# Patient Record
Sex: Male | Born: 1980 | Race: White | Hispanic: No | Marital: Single | Smoking: Current every day smoker
Health system: Southern US, Community
[De-identification: ages and names within clinical notes are randomized; demographics above are authoritative.]

## PROBLEM LIST (undated history)

## (undated) VITALS — BP 107/74 | HR 106 | Temp 97.2°F | Resp 24 | Ht 76.0 in | Wt 150.0 lb

## (undated) DIAGNOSIS — F1110 Opioid abuse, uncomplicated: Secondary | ICD-10-CM

## (undated) DIAGNOSIS — B029 Zoster without complications: Secondary | ICD-10-CM

## (undated) HISTORY — PX: HERNIA REPAIR: SHX51

## (undated) HISTORY — PX: FRACTURE SURGERY: SHX138

---

## 2007-09-15 ENCOUNTER — Emergency Department (HOSPITAL_COMMUNITY): Admission: EM | Admit: 2007-09-15 | Discharge: 2007-09-15 | Payer: Self-pay | Admitting: Emergency Medicine

## 2008-10-02 ENCOUNTER — Emergency Department (HOSPITAL_COMMUNITY): Admission: EM | Admit: 2008-10-02 | Discharge: 2008-10-02 | Payer: Self-pay | Admitting: Emergency Medicine

## 2008-11-19 ENCOUNTER — Emergency Department (HOSPITAL_COMMUNITY): Admission: EM | Admit: 2008-11-19 | Discharge: 2008-11-19 | Payer: Self-pay | Admitting: Emergency Medicine

## 2009-01-25 ENCOUNTER — Emergency Department (HOSPITAL_COMMUNITY): Admission: EM | Admit: 2009-01-25 | Discharge: 2009-01-25 | Payer: Self-pay | Admitting: Emergency Medicine

## 2009-07-11 ENCOUNTER — Emergency Department (HOSPITAL_COMMUNITY): Admission: EM | Admit: 2009-07-11 | Discharge: 2009-07-11 | Payer: Self-pay | Admitting: Emergency Medicine

## 2009-07-24 ENCOUNTER — Encounter: Admission: RE | Admit: 2009-07-24 | Discharge: 2009-08-15 | Payer: Self-pay | Admitting: Orthopedic Surgery

## 2009-11-26 ENCOUNTER — Emergency Department (HOSPITAL_COMMUNITY): Admission: EM | Admit: 2009-11-26 | Discharge: 2009-11-27 | Payer: Self-pay | Admitting: Emergency Medicine

## 2010-01-23 ENCOUNTER — Emergency Department (HOSPITAL_COMMUNITY): Admission: EM | Admit: 2010-01-23 | Discharge: 2010-01-24 | Payer: Self-pay | Admitting: Emergency Medicine

## 2010-06-20 ENCOUNTER — Emergency Department (HOSPITAL_COMMUNITY)
Admission: EM | Admit: 2010-06-20 | Discharge: 2010-06-20 | Payer: Self-pay | Source: Home / Self Care | Admitting: Family Medicine

## 2010-09-06 LAB — RAPID URINE DRUG SCREEN, HOSP PERFORMED
Amphetamines: NOT DETECTED
Cocaine: NOT DETECTED
Opiates: NOT DETECTED

## 2010-09-06 LAB — ETHANOL: Alcohol, Ethyl (B): 136 mg/dL — ABNORMAL HIGH (ref 0–10)

## 2010-09-06 LAB — COMPREHENSIVE METABOLIC PANEL
ALT: 25 U/L (ref 0–53)
Alkaline Phosphatase: 79 U/L (ref 39–117)
GFR calc Af Amer: >60 mL/min (ref >60)
GFR calc non Af Amer: >60 mL/min (ref >60)
Potassium: 4 mEq/L (ref 3.5–5.1)
Sodium: 140 mEq/L (ref 135–145)
Total Bilirubin: 1 mg/dL (ref 0.3–1.2)

## 2010-09-06 LAB — DIFFERENTIAL
Basophils Relative: 0 % (ref 0–1)
Eosinophils Absolute: 0.1 10*3/uL (ref 0.0–0.7)
Eosinophils Relative: 1 % (ref 0–5)

## 2010-09-06 LAB — CBC
HCT: 43 % (ref 39.0–52.0)
Hemoglobin: 15.1 g/dL (ref 13.0–17.0)
MCV: 91.4 fL (ref 78.0–100.0)
WBC: 5.4 10*3/uL (ref 4.0–10.5)

## 2010-09-08 ENCOUNTER — Emergency Department (HOSPITAL_COMMUNITY): Payer: Self-pay

## 2010-09-08 ENCOUNTER — Emergency Department (HOSPITAL_COMMUNITY)
Admission: EM | Admit: 2010-09-08 | Discharge: 2010-09-08 | Disposition: A | Discharge status: Home / Self Care | Payer: Self-pay | Attending: Emergency Medicine | Admitting: Emergency Medicine

## 2010-09-08 DIAGNOSIS — X500XXA Overexertion from strenuous movement or load, initial encounter: Secondary | ICD-10-CM | POA: Insufficient documentation

## 2010-09-08 DIAGNOSIS — M25569 Pain in unspecified knee: Secondary | ICD-10-CM | POA: Insufficient documentation

## 2010-09-08 DIAGNOSIS — Y9366 Activity, soccer: Secondary | ICD-10-CM | POA: Insufficient documentation

## 2010-09-09 LAB — RAPID URINE DRUG SCREEN, HOSP PERFORMED
Amphetamines: NOT DETECTED
Barbiturates: NOT DETECTED
Barbiturates: NOT DETECTED
Benzodiazepines: NOT DETECTED
Benzodiazepines: NOT DETECTED
Cocaine: NOT DETECTED
Cocaine: NOT DETECTED
Tetrahydrocannabinol: POSITIVE — AB

## 2010-09-09 LAB — BASIC METABOLIC PANEL
Chloride: 109 mEq/L (ref 96–112)
GFR calc Af Amer: >60 mL/min (ref >60)
Potassium: 3.8 mEq/L (ref 3.5–5.1)

## 2010-09-09 LAB — URINALYSIS, ROUTINE W REFLEX MICROSCOPIC
Bilirubin Urine: NEGATIVE
Glucose, UA: NEGATIVE mg/dL
Hgb urine dipstick: NEGATIVE
Specific Gravity, Urine: 1.011 (ref 1.005–1.030)
pH: 5.5 (ref 5.0–8.0)

## 2010-09-09 LAB — HEPATIC FUNCTION PANEL
ALT: 23 U/L (ref 0–53)
AST: 26 U/L (ref 0–37)
Albumin: 4.2 g/dL (ref 3.5–5.2)
Alkaline Phosphatase: 85 U/L (ref 39–117)
Total Protein: 7 g/dL (ref 6.0–8.3)

## 2010-09-09 LAB — DIFFERENTIAL
Eosinophils Absolute: 0 10*3/uL (ref 0.0–0.7)
Eosinophils Relative: 1 % (ref 0–5)
Lymphocytes Relative: 30 % (ref 12–46)
Lymphs Abs: 1.8 10*3/uL (ref 0.7–4.0)
Monocytes Relative: 5 % (ref 3–12)
Neutrophils Relative %: 64 % (ref 43–77)

## 2010-09-09 LAB — ETHANOL: Alcohol, Ethyl (B): 167 mg/dL — ABNORMAL HIGH (ref 0–10)

## 2010-09-09 LAB — TRICYCLICS SCREEN, URINE: TCA Scrn: NOT DETECTED

## 2010-09-09 LAB — CBC
HCT: 44.9 % (ref 39.0–52.0)
RDW: 13 % (ref 11.5–15.5)

## 2010-09-16 ENCOUNTER — Other Ambulatory Visit (HOSPITAL_COMMUNITY): Payer: Self-pay | Admitting: Orthopedic Surgery

## 2010-09-16 DIAGNOSIS — M25561 Pain in right knee: Secondary | ICD-10-CM

## 2010-09-16 DIAGNOSIS — S83519A Sprain of anterior cruciate ligament of unspecified knee, initial encounter: Secondary | ICD-10-CM

## 2010-09-20 ENCOUNTER — Ambulatory Visit (HOSPITAL_COMMUNITY)
Admission: RE | Admit: 2010-09-20 | Discharge: 2010-09-20 | Disposition: A | Discharge status: Home / Self Care | Payer: Self-pay | Source: Ambulatory Visit | Attending: Orthopedic Surgery | Admitting: Orthopedic Surgery

## 2010-09-20 DIAGNOSIS — M948X9 Other specified disorders of cartilage, unspecified sites: Secondary | ICD-10-CM | POA: Insufficient documentation

## 2010-09-20 DIAGNOSIS — M25569 Pain in unspecified knee: Secondary | ICD-10-CM | POA: Insufficient documentation

## 2010-09-20 DIAGNOSIS — S83519A Sprain of anterior cruciate ligament of unspecified knee, initial encounter: Secondary | ICD-10-CM

## 2010-09-20 DIAGNOSIS — M25561 Pain in right knee: Secondary | ICD-10-CM

## 2010-12-21 ENCOUNTER — Emergency Department (HOSPITAL_COMMUNITY): Payer: Self-pay

## 2010-12-21 ENCOUNTER — Emergency Department (HOSPITAL_COMMUNITY)
Admission: EM | Admit: 2010-12-21 | Discharge: 2010-12-21 | Disposition: A | Discharge status: Home / Self Care | Payer: Self-pay | Attending: Emergency Medicine | Admitting: Emergency Medicine

## 2010-12-21 DIAGNOSIS — Z87898 Personal history of other specified conditions: Secondary | ICD-10-CM | POA: Insufficient documentation

## 2010-12-21 DIAGNOSIS — R071 Chest pain on breathing: Secondary | ICD-10-CM | POA: Insufficient documentation

## 2010-12-21 LAB — POCT I-STAT, CHEM 8
Chloride: 105 mEq/L (ref 96–112)
HCT: 41 % (ref 39.0–52.0)
Potassium: 3.6 mEq/L (ref 3.5–5.1)

## 2010-12-21 LAB — CK TOTAL AND CKMB (NOT AT ARMC)
CK, MB: 2.9 ng/mL (ref 0.3–4.0)
Relative Index: 1.8 (ref 0.0–2.5)
Total CK: 162 U/L (ref 7–232)

## 2010-12-21 LAB — TROPONIN I: Troponin I: <0.3 ng/mL (ref <0.30)

## 2011-01-21 ENCOUNTER — Emergency Department (HOSPITAL_COMMUNITY)
Admission: EM | Admit: 2011-01-21 | Discharge: 2011-01-21 | Disposition: A | Discharge status: Home / Self Care | Payer: Self-pay | Attending: Emergency Medicine | Admitting: Emergency Medicine

## 2011-01-21 DIAGNOSIS — R112 Nausea with vomiting, unspecified: Secondary | ICD-10-CM | POA: Insufficient documentation

## 2011-01-21 DIAGNOSIS — E860 Dehydration: Secondary | ICD-10-CM | POA: Insufficient documentation

## 2011-01-21 DIAGNOSIS — F411 Generalized anxiety disorder: Secondary | ICD-10-CM | POA: Insufficient documentation

## 2011-01-21 DIAGNOSIS — K219 Gastro-esophageal reflux disease without esophagitis: Secondary | ICD-10-CM | POA: Insufficient documentation

## 2011-01-21 DIAGNOSIS — E876 Hypokalemia: Secondary | ICD-10-CM | POA: Insufficient documentation

## 2011-01-21 DIAGNOSIS — R109 Unspecified abdominal pain: Secondary | ICD-10-CM | POA: Insufficient documentation

## 2011-01-21 LAB — URINE MICROSCOPIC-ADD ON

## 2011-01-21 LAB — URINALYSIS, ROUTINE W REFLEX MICROSCOPIC
Glucose, UA: NEGATIVE mg/dL
Ketones, ur: 40 mg/dL — AB
Leukocytes, UA: NEGATIVE
pH: 7 (ref 5.0–8.0)

## 2011-01-21 LAB — CBC
MCV: 88.1 fL (ref 78.0–100.0)
Platelets: 188 10*3/uL (ref 150–400)
RDW: 11.8 % (ref 11.5–15.5)
WBC: 5.4 10*3/uL (ref 4.0–10.5)

## 2011-01-21 LAB — DIFFERENTIAL
Basophils Absolute: 0 10*3/uL (ref 0.0–0.1)
Eosinophils Absolute: 0 10*3/uL (ref 0.0–0.7)
Eosinophils Relative: 0 % (ref 0–5)

## 2011-01-21 LAB — COMPREHENSIVE METABOLIC PANEL
AST: 21 U/L (ref 0–37)
Albumin: 4.3 g/dL (ref 3.5–5.2)
Chloride: 96 mEq/L (ref 96–112)
Creatinine, Ser: 0.79 mg/dL (ref 0.50–1.35)
Total Bilirubin: 1.2 mg/dL (ref 0.3–1.2)

## 2011-01-29 ENCOUNTER — Emergency Department (HOSPITAL_COMMUNITY): Payer: Self-pay

## 2011-01-29 ENCOUNTER — Emergency Department (HOSPITAL_COMMUNITY)
Admission: EM | Admit: 2011-01-29 | Discharge: 2011-01-30 | Disposition: A | Discharge status: Home / Self Care | Payer: Self-pay | Attending: Emergency Medicine | Admitting: Emergency Medicine

## 2011-01-29 DIAGNOSIS — R112 Nausea with vomiting, unspecified: Secondary | ICD-10-CM | POA: Insufficient documentation

## 2011-01-29 DIAGNOSIS — R079 Chest pain, unspecified: Secondary | ICD-10-CM | POA: Insufficient documentation

## 2011-01-29 DIAGNOSIS — R0602 Shortness of breath: Secondary | ICD-10-CM | POA: Insufficient documentation

## 2011-01-29 DIAGNOSIS — F1110 Opioid abuse, uncomplicated: Secondary | ICD-10-CM | POA: Insufficient documentation

## 2011-01-29 LAB — COMPREHENSIVE METABOLIC PANEL
AST: 27 U/L (ref 0–37)
Albumin: 4.5 g/dL (ref 3.5–5.2)
Calcium: 10 mg/dL (ref 8.4–10.5)
Creatinine, Ser: 0.89 mg/dL (ref 0.50–1.35)
Total Protein: 8.4 g/dL — ABNORMAL HIGH (ref 6.0–8.3)

## 2011-01-29 LAB — RAPID URINE DRUG SCREEN, HOSP PERFORMED
Amphetamines: NOT DETECTED
Cocaine: NOT DETECTED
Opiates: POSITIVE — AB
Tetrahydrocannabinol: NOT DETECTED

## 2011-01-29 LAB — DIFFERENTIAL
Basophils Absolute: 0 10*3/uL (ref 0.0–0.1)
Basophils Relative: 0 % (ref 0–1)
Eosinophils Relative: 1 % (ref 0–5)
Monocytes Absolute: 0.6 10*3/uL (ref 0.1–1.0)
Monocytes Relative: 9 % (ref 3–12)

## 2011-01-29 LAB — CBC
HCT: 40.1 % (ref 39.0–52.0)
MCH: 32.3 pg (ref 26.0–34.0)
MCHC: 37.7 g/dL — ABNORMAL HIGH (ref 30.0–36.0)
RDW: 11.5 % (ref 11.5–15.5)

## 2011-01-29 LAB — LIPASE, BLOOD: Lipase: 23 U/L (ref 11–59)

## 2011-04-08 ENCOUNTER — Emergency Department (HOSPITAL_COMMUNITY)
Admission: EM | Admit: 2011-04-08 | Discharge: 2011-04-08 | Disposition: A | Discharge status: Home / Self Care | Payer: Self-pay | Attending: Emergency Medicine | Admitting: Emergency Medicine

## 2011-04-08 DIAGNOSIS — J029 Acute pharyngitis, unspecified: Secondary | ICD-10-CM | POA: Insufficient documentation

## 2011-04-08 DIAGNOSIS — H9209 Otalgia, unspecified ear: Secondary | ICD-10-CM | POA: Insufficient documentation

## 2011-04-08 DIAGNOSIS — R599 Enlarged lymph nodes, unspecified: Secondary | ICD-10-CM | POA: Insufficient documentation

## 2011-04-08 LAB — RAPID STREP SCREEN (MED CTR MEBANE ONLY): Streptococcus, Group A Screen (Direct): NEGATIVE

## 2011-05-07 ENCOUNTER — Encounter: Payer: Self-pay | Admitting: *Deleted

## 2011-05-07 ENCOUNTER — Emergency Department (HOSPITAL_COMMUNITY)
Admission: EM | Admit: 2011-05-07 | Discharge: 2011-05-07 | Disposition: A | Discharge status: Home / Self Care | Payer: Self-pay | Attending: Emergency Medicine | Admitting: Emergency Medicine

## 2011-05-07 DIAGNOSIS — B029 Zoster without complications: Secondary | ICD-10-CM | POA: Insufficient documentation

## 2011-05-07 DIAGNOSIS — F17200 Nicotine dependence, unspecified, uncomplicated: Secondary | ICD-10-CM | POA: Insufficient documentation

## 2011-05-07 MED ORDER — ACYCLOVIR 400 MG PO TABS: 800.0000 mg | ORAL_TABLET | Freq: Every day | ORAL | Status: AC

## 2011-05-07 MED ORDER — OXYCODONE-ACETAMINOPHEN 5-325 MG PO TABS: 2.0000 | ORAL_TABLET | ORAL | Status: AC | PRN

## 2011-05-07 NOTE — ED Notes (Signed)
Pt c/o rash and severe pain to mid R back. No drainage. C/o shob as well, he believes is r/t the pain.

## 2011-05-07 NOTE — ED Provider Notes (Signed)
History     CSN: [id number] Arrival date & time: 05/07/2011  9:59 AM   First MD Initiated Contact with Patient 05/07/11 1015      Chief Complaint  Patient presents with  . Rash    (Consider location/radiation/quality/duration/timing/severity/associated sxs/prior treatment) HPI Comments: No known sick contacts.  Patient reports that he is otherwise healthy.  Patient is a 30 y.o. male presenting with rash. The history is provided by the patient.  Rash  This is a new problem. The current episode started 2 days ago. The problem has been gradually worsening. The problem is associated with nothing. There has been no fever. The rash is present on the back. The pain is severe. Associated symptoms include pain. Pertinent negatives include no itching and no weeping. He has tried nothing for the symptoms.    Past Medical History  Diagnosis Date  . Pneumothorax     Past Surgical History  Procedure Date  . Hernia repair     No family history on file.  History  Substance Use Topics  . Smoking status: Current Everyday Smoker  . Smokeless tobacco: Not on file  . Alcohol Use: No     occasionally      Review of Systems  Constitutional: Negative for fever, chills, diaphoresis, activity change, appetite change, fatigue and unexpected weight change.  HENT: Negative for sore throat, neck pain and neck stiffness.   Gastrointestinal: Negative for nausea and vomiting.  Skin: Positive for rash. Negative for itching, pallor and wound.  Neurological: Negative for dizziness and light-headedness.    Allergies  Review of patient's allergies indicates no known allergies.  Home Medications   Current Outpatient Rx  Name Route Sig Dispense Refill  . IBUPROFEN 200 MG PO TABS Oral Take 600 mg by mouth every 6 (six) hours as needed. Pain        BP 126/69  Pulse 69  Temp(Src) 97.6 F (36.4 C) (Oral)  Resp 18  SpO2 100%  Physical Exam  Constitutional: He is oriented to person, place,  and time. He appears well-developed and well-nourished.  HENT:  Head: Normocephalic and atraumatic.  Eyes: EOM are normal. Pupils are equal, round, and reactive to light.  Neck: Normal range of motion. Neck supple.  Cardiovascular: Normal rate, regular rhythm and normal heart sounds.   Pulmonary/Chest: Effort normal and breath sounds normal.  Musculoskeletal: Normal range of motion.  Neurological: He is alert and oriented to person, place, and time.  Skin: Skin is warm and dry.     Psychiatric: He has a normal mood and affect.    ED Course  Procedures (including critical care time)  Labs Reviewed - No data to display No results found.   1. Shingles       MDM  History and PE consistent with Shingles.  Patient given pain medications and prescription for Acyclovir.  Patient instructed to follow up with Primary Care Provider for follow up and further testing if the rash becomes recurrent.        Pascal Lux [facility] 05/07/11 1621

## 2011-05-07 NOTE — ED Provider Notes (Signed)
Medical screening examination/treatment/procedure(s) were performed by non-physician practitioner and as supervising physician I was immediately available for consultation/collaboration.   Glynn Octave, MD 05/07/11 Rickey Primus

## 2011-05-07 NOTE — ED Notes (Signed)
Pt denies fever and chills.  

## 2011-05-07 NOTE — ED Notes (Signed)
Pt with round rash patch on right back c/o of pain to the area. Pt reports he noticed it 2 days ago and the pain is severe to day.

## 2011-06-25 ENCOUNTER — Emergency Department (HOSPITAL_COMMUNITY): Payer: Self-pay

## 2011-06-25 ENCOUNTER — Encounter (HOSPITAL_COMMUNITY): Payer: Self-pay

## 2011-06-25 ENCOUNTER — Emergency Department (HOSPITAL_COMMUNITY)
Admission: EM | Admit: 2011-06-25 | Discharge: 2011-06-25 | Disposition: A | Discharge status: Home / Self Care | Payer: Self-pay | Attending: Emergency Medicine | Admitting: Emergency Medicine

## 2011-06-25 DIAGNOSIS — J3489 Other specified disorders of nose and nasal sinuses: Secondary | ICD-10-CM | POA: Insufficient documentation

## 2011-06-25 DIAGNOSIS — R5383 Other fatigue: Secondary | ICD-10-CM | POA: Insufficient documentation

## 2011-06-25 DIAGNOSIS — K529 Noninfective gastroenteritis and colitis, unspecified: Secondary | ICD-10-CM

## 2011-06-25 DIAGNOSIS — R61 Generalized hyperhidrosis: Secondary | ICD-10-CM | POA: Insufficient documentation

## 2011-06-25 DIAGNOSIS — R197 Diarrhea, unspecified: Secondary | ICD-10-CM | POA: Insufficient documentation

## 2011-06-25 DIAGNOSIS — J40 Bronchitis, not specified as acute or chronic: Secondary | ICD-10-CM | POA: Insufficient documentation

## 2011-06-25 DIAGNOSIS — R1110 Vomiting, unspecified: Secondary | ICD-10-CM | POA: Insufficient documentation

## 2011-06-25 DIAGNOSIS — J209 Acute bronchitis, unspecified: Secondary | ICD-10-CM

## 2011-06-25 DIAGNOSIS — R51 Headache: Secondary | ICD-10-CM | POA: Insufficient documentation

## 2011-06-25 DIAGNOSIS — H5789 Other specified disorders of eye and adnexa: Secondary | ICD-10-CM | POA: Insufficient documentation

## 2011-06-25 DIAGNOSIS — K5289 Other specified noninfective gastroenteritis and colitis: Secondary | ICD-10-CM | POA: Insufficient documentation

## 2011-06-25 DIAGNOSIS — R0989 Other specified symptoms and signs involving the circulatory and respiratory systems: Secondary | ICD-10-CM | POA: Insufficient documentation

## 2011-06-25 DIAGNOSIS — F17200 Nicotine dependence, unspecified, uncomplicated: Secondary | ICD-10-CM | POA: Insufficient documentation

## 2011-06-25 DIAGNOSIS — R05 Cough: Secondary | ICD-10-CM | POA: Insufficient documentation

## 2011-06-25 DIAGNOSIS — R5381 Other malaise: Secondary | ICD-10-CM | POA: Insufficient documentation

## 2011-06-25 DIAGNOSIS — R059 Cough, unspecified: Secondary | ICD-10-CM | POA: Insufficient documentation

## 2011-06-25 HISTORY — DX: Zoster without complications: B02.9

## 2011-06-25 LAB — COMPREHENSIVE METABOLIC PANEL
ALT: 20 U/L (ref 0–53)
Alkaline Phosphatase: 72 U/L (ref 39–117)
CO2: 26 mEq/L (ref 19–32)
GFR calc Af Amer: >90 mL/min (ref >90)
GFR calc non Af Amer: >90 mL/min (ref >90)
Glucose, Bld: 85 mg/dL (ref 70–99)
Potassium: 3.9 mEq/L (ref 3.5–5.1)
Sodium: 133 mEq/L — ABNORMAL LOW (ref 135–145)
Total Protein: 7.8 g/dL (ref 6.0–8.3)

## 2011-06-25 LAB — DIFFERENTIAL
Lymphocytes Relative: 34 % (ref 12–46)
Lymphs Abs: 1.6 10*3/uL (ref 0.7–4.0)
Neutrophils Relative %: 57 % (ref 43–77)

## 2011-06-25 LAB — CBC
Platelets: 129 10*3/uL — ABNORMAL LOW (ref 150–400)
RBC: 4.54 MIL/uL (ref 4.22–5.81)
WBC: 4.9 10*3/uL (ref 4.0–10.5)

## 2011-06-25 MED ORDER — ONDANSETRON HCL 4 MG/2ML IJ SOLN
4.0000 mg | Freq: Once | INTRAMUSCULAR | Status: AC
  Administered 2011-06-25: 4 mg via INTRAVENOUS
  Filled 2011-06-25: qty 2

## 2011-06-25 MED ORDER — IPRATROPIUM BROMIDE 0.02 % IN SOLN
0.5000 mg | Freq: Once | RESPIRATORY_TRACT | Status: AC
  Administered 2011-06-25: 0.5 mg via RESPIRATORY_TRACT
  Filled 2011-06-25: qty 2.5

## 2011-06-25 MED ORDER — PROMETHAZINE HCL 25 MG PO TABS: 25.0000 mg | ORAL_TABLET | Freq: Four times a day (QID) | ORAL | Status: DC | PRN

## 2011-06-25 MED ORDER — ALBUTEROL SULFATE HFA 108 (90 BASE) MCG/ACT IN AERS
1.0000 | INHALATION_SPRAY | RESPIRATORY_TRACT | Status: DC | PRN
  Administered 2011-06-25: 2 via RESPIRATORY_TRACT
  Filled 2011-06-25: qty 6.7

## 2011-06-25 MED ORDER — SODIUM CHLORIDE 0.9 % IV BOLUS (SEPSIS)
1000.0000 mL | Freq: Once | INTRAVENOUS | Status: AC
  Administered 2011-06-25: 1000 mL via INTRAVENOUS

## 2011-06-25 MED ORDER — SODIUM CHLORIDE 0.9 % IV SOLN
999.0000 mL | Freq: Once | INTRAVENOUS | Status: AC
  Administered 2011-06-25: 1000 mL via INTRAVENOUS

## 2011-06-25 MED ORDER — ALBUTEROL SULFATE (5 MG/ML) 0.5% IN NEBU
5.0000 mg | INHALATION_SOLUTION | Freq: Once | RESPIRATORY_TRACT | Status: AC
  Administered 2011-06-25: 5 mg via RESPIRATORY_TRACT
  Filled 2011-06-25: qty 1

## 2011-06-25 MED ORDER — AZITHROMYCIN 250 MG PO TABS: 250.0000 mg | ORAL_TABLET | Freq: Every day | ORAL | Status: AC

## 2011-06-25 NOTE — ED Notes (Signed)
DC iv 

## 2011-06-25 NOTE — ED Provider Notes (Signed)
History     CSN: [id number]  Arrival date & time 06/25/11  0908   First MD Initiated Contact with Patient 06/25/11 [phone]      Chief Complaint  Patient presents with  . Emesis  . Diarrhea    (Consider location/radiation/quality/duration/timing/severity/associated sxs/prior treatment) Patient is a 31 y.o. male presenting with vomiting.  Emesis  The current episode started yesterday. The problem occurs more than 10 times per day. The problem has not changed since onset.The emesis has an appearance of stomach contents. There has been no fever. Associated symptoms include cough, headaches and myalgias. Risk factors include ill contacts.  Pt with 1 week of cough, nasal congestion, HA, and sinus pressure. States was feeling better and began vomiting yesterday, 20+ times since onset. He does not currently complain of abdominal pain. Has had a few loose stools this morning. No blood in either vomitus or stool  Past Medical History  Diagnosis Date  . Pneumothorax   . Shingles     Past Surgical History  Procedure Date  . Hernia repair     No family history on file.  History  Substance Use Topics  . Smoking status: Current Everyday Smoker  . Smokeless tobacco: Not on file  . Alcohol Use: No     occasionally      Review of Systems  Constitutional: Positive for diaphoresis and fatigue.  HENT: Positive for congestion and rhinorrhea.   Eyes: Positive for discharge. Negative for redness and visual disturbance.  Respiratory: Positive for cough and wheezing.   Cardiovascular: Negative for chest pain.  Gastrointestinal: Negative for abdominal distention.  Genitourinary: Negative for dysuria and flank pain.  Musculoskeletal: Positive for myalgias.  Neurological: Positive for headaches. Negative for weakness and numbness.    Allergies  Review of patient's allergies indicates no known allergies.  Home Medications   Current Outpatient Rx  Name Route Sig Dispense Refill  .  IBUPROFEN 200 MG PO TABS Oral Take 600 mg by mouth every 6 (six) hours as needed. For fever/Pain     . OVER THE COUNTER MEDICATION Oral Take 1 tablet by mouth daily. Airborne       BP 108/67  Pulse 79  Temp(Src) 99.3 F (37.4 C) (Oral)  Resp 19  Wt 180 lb (81.647 kg)  SpO2 97%  Physical Exam  Constitutional: He is oriented to person, place, and time. He appears well-developed and well-nourished. No distress.  HENT:  Head: Normocephalic and atraumatic.  Mouth/Throat: No oropharyngeal exudate.       Mucous membranes dry, mild percussion tenderness over frontal and maxillary sinuses   Eyes: Conjunctivae and EOM are normal. Pupils are equal, round, and reactive to light. Right eye exhibits no discharge. No scleral icterus.  Neck: Normal range of motion. Neck supple. No tracheal deviation present.  Cardiovascular: Normal rate, regular rhythm and normal heart sounds.  Exam reveals no gallop and no friction rub.   No murmur heard. Pulmonary/Chest: Effort normal. No stridor. No respiratory distress. He has wheezes. He has rales.  Abdominal: Soft. He exhibits no distension and no mass. There is no tenderness. There is no rebound and no guarding.  Musculoskeletal: He exhibits no edema and no tenderness.  Lymphadenopathy:    He has no cervical adenopathy.  Neurological: He is alert and oriented to person, place, and time. No cranial nerve deficit. Coordination normal.  Skin: Skin is warm and dry. He is not diaphoretic.    ED Course  Procedures (including critical care time)   Labs  Reviewed  CBC  DIFFERENTIAL  COMPREHENSIVE METABOLIC PANEL  LIPASE, BLOOD   No results found.   No diagnosis found.    MDM  Chest Xray reviewed by myself. No change from prev. No definite cardiopulmonary disease.  Pt states feeling better after neb treatment and NS. Tolerating fluids in ED. No N/V        Loren Racer, MD 06/27/11 [phone]

## 2011-06-25 NOTE — ED Notes (Signed)
Pt c/o flu s/s x3days, n/v/d since yesterday

## 2011-07-01 ENCOUNTER — Emergency Department (HOSPITAL_COMMUNITY)
Admission: EM | Admit: 2011-07-01 | Discharge: 2011-07-01 | Disposition: A | Discharge status: Home Health | Payer: Self-pay | Attending: Emergency Medicine | Admitting: Emergency Medicine

## 2011-07-01 ENCOUNTER — Encounter (HOSPITAL_COMMUNITY): Payer: Self-pay | Admitting: *Deleted

## 2011-07-01 ENCOUNTER — Emergency Department (HOSPITAL_COMMUNITY): Payer: Self-pay

## 2011-07-01 DIAGNOSIS — IMO0001 Reserved for inherently not codable concepts without codable children: Secondary | ICD-10-CM | POA: Insufficient documentation

## 2011-07-01 DIAGNOSIS — R05 Cough: Secondary | ICD-10-CM | POA: Insufficient documentation

## 2011-07-01 DIAGNOSIS — R112 Nausea with vomiting, unspecified: Secondary | ICD-10-CM | POA: Insufficient documentation

## 2011-07-01 DIAGNOSIS — J40 Bronchitis, not specified as acute or chronic: Secondary | ICD-10-CM | POA: Insufficient documentation

## 2011-07-01 DIAGNOSIS — R062 Wheezing: Secondary | ICD-10-CM | POA: Insufficient documentation

## 2011-07-01 DIAGNOSIS — R0602 Shortness of breath: Secondary | ICD-10-CM | POA: Insufficient documentation

## 2011-07-01 DIAGNOSIS — R059 Cough, unspecified: Secondary | ICD-10-CM | POA: Insufficient documentation

## 2011-07-01 LAB — CBC
Hemoglobin: 11.8 g/dL — ABNORMAL LOW (ref 13.0–17.0)
MCH: 29.6 pg (ref 26.0–34.0)
MCHC: 34.7 g/dL (ref 30.0–36.0)

## 2011-07-01 LAB — POCT I-STAT, CHEM 8
BUN: 16 mg/dL (ref 6–23)
Calcium, Ion: 1.17 mmol/L (ref 1.12–1.32)
Chloride: 102 mEq/L (ref 96–112)
Creatinine, Ser: 0.8 mg/dL (ref 0.50–1.35)
Glucose, Bld: 93 mg/dL (ref 70–99)
HCT: 36 % — ABNORMAL LOW (ref 39.0–52.0)
Hemoglobin: 12.2 g/dL — ABNORMAL LOW (ref 13.0–17.0)
Potassium: 3.4 mEq/L — ABNORMAL LOW (ref 3.5–5.1)
Sodium: 138 mEq/L (ref 135–145)
TCO2: 24 mmol/L (ref 0–100)

## 2011-07-01 LAB — DIFFERENTIAL
Basophils Absolute: 0 10*3/uL (ref 0.0–0.1)
Basophils Relative: 0 % (ref 0–1)
Eosinophils Absolute: 0 10*3/uL (ref 0.0–0.7)
Eosinophils Relative: 1 % (ref 0–5)
Lymphocytes Relative: 23 % (ref 12–46)
Lymphs Abs: 1.9 10*3/uL (ref 0.7–4.0)
Monocytes Absolute: 0.9 10*3/uL (ref 0.1–1.0)
Monocytes Relative: 10 % (ref 3–12)
Neutro Abs: 5.7 10*3/uL (ref 1.7–7.7)
Neutrophils Relative %: 67 % (ref 43–77)

## 2011-07-01 MED ORDER — ALBUTEROL SULFATE (5 MG/ML) 0.5% IN NEBU
5.0000 mg | INHALATION_SOLUTION | Freq: Once | RESPIRATORY_TRACT | Status: AC
  Administered 2011-07-01: 5 mg via RESPIRATORY_TRACT
  Filled 2011-07-01: qty 1

## 2011-07-01 MED ORDER — ONDANSETRON HCL 4 MG/2ML IJ SOLN
4.0000 mg | Freq: Once | INTRAMUSCULAR | Status: AC
  Administered 2011-07-01: 4 mg via INTRAVENOUS
  Filled 2011-07-01: qty 2

## 2011-07-01 MED ORDER — POTASSIUM CHLORIDE CRYS ER 20 MEQ PO TBCR
40.0000 meq | EXTENDED_RELEASE_TABLET | Freq: Once | ORAL | Status: AC
  Administered 2011-07-01: 40 meq via ORAL
  Filled 2011-07-01: qty 2

## 2011-07-01 MED ORDER — ACETAMINOPHEN 500 MG PO TABS
1000.0000 mg | ORAL_TABLET | Freq: Once | ORAL | Status: AC
  Administered 2011-07-01: 1000 mg via ORAL
  Filled 2011-07-01: qty 1

## 2011-07-01 MED ORDER — ONDANSETRON HCL 4 MG PO TABS: 8.0000 mg | ORAL_TABLET | Freq: Two times a day (BID) | ORAL | Status: AC | PRN

## 2011-07-01 MED ORDER — SODIUM CHLORIDE 0.9 % IV BOLUS (SEPSIS)
1000.0000 mL | Freq: Once | INTRAVENOUS | Status: AC
  Administered 2011-07-01: 1000 mL via INTRAVENOUS

## 2011-07-01 NOTE — ED Notes (Signed)
Patient transported to X-ray 

## 2011-07-01 NOTE — ED Notes (Signed)
Respiratory called for Tx

## 2011-07-01 NOTE — ED Notes (Signed)
Pt reports flu-like symptoms- vomiting, cough, chest congestion, shortness of breath, dizziness x2 weeks. Was seen last week for same, returned to work yesterday and was very dizzy.

## 2011-07-01 NOTE — ED Provider Notes (Cosign Needed)
History     CSN: [id number]  Arrival date & time 07/01/11  0740   First MD Initiated Contact with Patient 07/01/11 [phone]      Chief Complaint  Patient presents with  . Cough  . Nasal Congestion  . Shortness of Breath  . Dizziness  . Fever    (Consider location/radiation/quality/duration/timing/severity/associated sxs/prior treatment) HPI Complains of productive cough nasal congestion diffuse myalgias generalized weakness and vomiting for approximately 2 weeks. Seen here 06/25/2011 for same complaint prescribed Zithromax and Phenergan, and albuterol inhaler which she took with transient relief until yesterday. Patient returned to work yesterday and symptoms returned as the day progressed. Vomiting is sometimes posttussive and sometimes not. Past Medical History  Diagnosis Date  . Pneumothorax   . Shingles     Past Surgical History  Procedure Date  . Hernia repair     No family history on file.  History  Substance Use Topics  . Smoking status: Current Everyday Smoker  . Smokeless tobacco: Not on file  . Alcohol Use: No     occasionally   denies illicit drug    Review of Systems  Constitutional: Positive for fatigue.  HENT: Positive for congestion.   Respiratory: Positive for cough and shortness of breath.   Cardiovascular: Negative.   Gastrointestinal: Positive for nausea and vomiting.  Musculoskeletal: Positive for myalgias.  Skin: Negative.   Neurological: Negative.   Hematological: Negative.   Psychiatric/Behavioral: Negative.   All other systems reviewed and are negative.    Allergies  Review of patient's allergies indicates no known allergies.  Home Medications   Current Outpatient Rx  Name Route Sig Dispense Refill  . AZITHROMYCIN 250 MG PO TABS Oral Take 1 tablet (250 mg total) by mouth daily. Take first 2 tablets together, then 1 every day until finished. 6 tablet 0  . IBUPROFEN 200 MG PO TABS Oral Take 600 mg by mouth every 6 (six) hours as  needed. For fever/Pain     . OVER THE COUNTER MEDICATION Oral Take 1 tablet by mouth daily. Airborne     . PROMETHAZINE HCL 25 MG PO TABS Oral Take 25 mg by mouth every 8 (eight) hours as needed.      Marland Kitchen PROMETHAZINE HCL 25 MG PO TABS Oral Take 1 tablet (25 mg total) by mouth every 6 (six) hours as needed for nausea. 30 tablet 0    BP 109/66  Pulse 86  Temp 98.6 F (37 C)  Resp 22  SpO2 98%  Physical Exam  Nursing note and vitals reviewed. Pulmonary/Chest: He has wheezes.       Speaks in paragraphs and expiratory wheezes    ED Course  Procedures (including critical care time) Feels improved after treatment with albuterol nebulizer, and intravenous fluids and antiemetics and Tylenol. Labs Reviewed - No data to display No results found. Results for orders placed during the hospital encounter of 07/01/11  CBC      Component Value Range   WBC 8.6  4.0 - 10.5 (K/uL)   RBC 3.98 (*) 4.22 - 5.81 (MIL/uL)   Hemoglobin 11.8 (*) 13.0 - 17.0 (g/dL)   HCT 13.2 (*) 44.0 - 52.0 (%)   MCV 85.4  78.0 - 100.0 (fL)   MCH 29.6  26.0 - 34.0 (pg)   MCHC 34.7  30.0 - 36.0 (g/dL)   RDW 10.2  72.5 - 36.6 (%)   Platelets 153  150 - 400 (K/uL)  DIFFERENTIAL      Component Value Range  Neutrophils Relative 67  43 - 77 (%)   Neutro Abs 5.7  1.7 - 7.7 (K/uL)   Lymphocytes Relative 23  12 - 46 (%)   Lymphs Abs 1.9  0.7 - 4.0 (K/uL)   Monocytes Relative 10  3 - 12 (%)   Monocytes Absolute 0.9  0.1 - 1.0 (K/uL)   Eosinophils Relative 1  0 - 5 (%)   Eosinophils Absolute 0.0  0.0 - 0.7 (K/uL)   Basophils Relative 0  0 - 1 (%)   Basophils Absolute 0.0  0.0 - 0.1 (K/uL)  POCT I-STAT, CHEM 8      Component Value Range   Sodium 138  135 - 145 (mEq/L)   Potassium 3.4 (*) 3.5 - 5.1 (mEq/L)   Chloride 102  96 - 112 (mEq/L)   BUN 16  6 - 23 (mg/dL)   Creatinine, Ser 4.54  0.50 - 1.35 (mg/dL)   Glucose, Bld 93  70 - 99 (mg/dL)   Calcium, Ion 0.98  1.19 - 1.32 (mmol/L)   TCO2 24  0 - 100 (mmol/L)    Hemoglobin 12.2 (*) 13.0 - 17.0 (g/dL)   HCT 14.7 (*) 82.9 - 52.0 (%)   Dg Chest 2 View  07/01/2011  *RADIOLOGY REPORT*  Clinical Data: Cough, congestion, shortness of breath.  CHEST - 2 VIEW  Comparison: 06/25/2011  Findings: Heart and mediastinal contours are within normal limits. No focal opacities or effusions.  No acute bony abnormality.Scarring in the apices, stable.  IMPRESSION: No active cardiopulmonary disease.  Original Report Authenticated By: Cyndie Chime, M.D.   Dg Chest 2 View  06/25/2011  *RADIOLOGY REPORT*  Clinical Data: Cough.  Low grade fever.  CHEST - 2 VIEW  Comparison:  12/21/2010  Findings:  The heart size and mediastinal contours are within normal limits.  Both lungs are clear.  The visualized skeletal structures are unremarkable.  IMPRESSION: No active cardiopulmonary disease.  Original Report Authenticated By: Danae Orleans, M.D.     No diagnosis found.    MDM  Suspect viral illness. Plan.smoking encouraged. Prescriptions zofran Encourage by mouth hydration Return or see [facility] if not improving in 3 days Diagnosis #1 bronchitis #2 nausea and vomiting #3 hypokalemia        Doug Sou, MD 07/01/11 1153  Doug Sou, MD 07/01/11 1708

## 2011-07-01 NOTE — ED Notes (Signed)
Given slips of h2o no with meds no emesis

## 2011-10-01 ENCOUNTER — Encounter (HOSPITAL_COMMUNITY): Payer: Self-pay

## 2011-10-01 ENCOUNTER — Emergency Department (HOSPITAL_COMMUNITY)
Admission: EM | Admit: 2011-10-01 | Discharge: 2011-10-01 | Disposition: A | Discharge status: Rehabilitation Facility | Payer: Self-pay | Attending: Emergency Medicine | Admitting: Emergency Medicine

## 2011-10-01 DIAGNOSIS — F1120 Opioid dependence, uncomplicated: Secondary | ICD-10-CM | POA: Insufficient documentation

## 2011-10-01 DIAGNOSIS — R209 Unspecified disturbances of skin sensation: Secondary | ICD-10-CM | POA: Insufficient documentation

## 2011-10-01 LAB — CBC
Hemoglobin: 12.8 g/dL — ABNORMAL LOW (ref 13.0–17.0)
MCHC: 36.9 g/dL — ABNORMAL HIGH (ref 30.0–36.0)
Platelets: 196 10*3/uL (ref 150–400)
RDW: 12.1 % (ref 11.5–15.5)

## 2011-10-01 LAB — COMPREHENSIVE METABOLIC PANEL
ALT: 13 U/L (ref 0–53)
AST: 15 U/L (ref 0–37)
Albumin: 4.2 g/dL (ref 3.5–5.2)
Alkaline Phosphatase: 64 U/L (ref 39–117)
Potassium: 3.5 mEq/L (ref 3.5–5.1)
Sodium: 134 mEq/L — ABNORMAL LOW (ref 135–145)
Total Protein: 7 g/dL (ref 6.0–8.3)

## 2011-10-01 LAB — RAPID URINE DRUG SCREEN, HOSP PERFORMED
Amphetamines: NOT DETECTED
Barbiturates: NOT DETECTED
Benzodiazepines: NOT DETECTED
Cocaine: NOT DETECTED

## 2011-10-01 MED ORDER — ZOLPIDEM TARTRATE 5 MG PO TABS: 5.0000 mg | ORAL_TABLET | Freq: Every evening | ORAL | Status: DC | PRN

## 2011-10-01 MED ORDER — ONDANSETRON HCL 4 MG PO TABS: 4.0000 mg | ORAL_TABLET | Freq: Three times a day (TID) | ORAL | Status: DC | PRN

## 2011-10-01 MED ORDER — ACETAMINOPHEN 325 MG PO TABS: 650.0000 mg | ORAL_TABLET | ORAL | Status: DC | PRN

## 2011-10-01 MED ORDER — IBUPROFEN 200 MG PO TABS
600.0000 mg | ORAL_TABLET | Freq: Three times a day (TID) | ORAL | Status: DC | PRN
  Administered 2011-10-01: 600 mg via ORAL
  Filled 2011-10-01: qty 1

## 2011-10-01 MED ORDER — LORAZEPAM 1 MG PO TABS: 1.0000 mg | ORAL_TABLET | Freq: Three times a day (TID) | ORAL | Status: DC | PRN

## 2011-10-01 NOTE — ED Notes (Signed)
Pt discharged via private vehicle by self to [facility]. Denies SI/HI. Pt to drive self to [facility]. Directions there given by CSW. D/C instructions reviewed. Pt verbalized understanding.

## 2011-10-01 NOTE — Discharge Instructions (Signed)
Followup at [facility], where they are expecting, you for treatment of heroin abuse

## 2011-10-01 NOTE — ED Notes (Signed)
No bed availability at [facility]. [facility] is reviewing, may not have open bed if they do not have d/c today. Currently no beds available at [facility].

## 2011-10-01 NOTE — BH Assessment (Signed)
Assessment Note   Joseph Duarte is an 31 y.o. male presented to [facility] requesting detox from heroin.  Patient reports that he has attempted to self-detox at home for the past week would use when sickness became too much to bear. Last used heroin (1 bag) at 9 pm last night. Average use is 4-5 times a week. He started using Heroin about 1 year ago but sought detox at [facility] about 4 months ago and felt this was helpful. Patient has history of alcohol abuse and states he had been attending [facility] Patient relates that he has not attended on a regular basis since he lost his job about 3 months ago. He states that he was living off of his savings but is now broke. He has also pawned items to have money for heroin.  Patient is requesting detox and treatment- he states that he has a 72 year old son and wants to be a part of his life.  He had sobriety for 4 months prior to relapse.  He has a supportive family but they live in [place]; he has one sister in this area.  Patient reports that the family is aware of his addiction and wants him to get help.  He reports increase in depression since relapse; states he has lost a lot of weight, not eating or sleeping.    Axis I: Opiod dependence; Mood Disorder, NOS Axis II: Deferred Axis III:  Past Medical History  Diagnosis Date  . Pneumothorax   . Shingles    Axis IV: economic problems, housing problems, occupational problems, other psychosocial or environmental problems, problems related to social environment and problems with primary support group Axis V: 41-50 serious symptoms  Past Medical History:  Past Medical History  Diagnosis Date  . Pneumothorax   . Shingles     Past Surgical History  Procedure Date  . Hernia repair     Family History: History reviewed. No pertinent family history.  Social History:  reports that he has been smoking.  He does not have any smokeless tobacco history on file. He reports that he does not drink alcohol or use illicit  drugs.  Additional Social History:    Allergies: No Known Allergies  Home Medications:  Medications Prior to Admission  Medication Dose Route Frequency Provider Last Rate Last Dose  . acetaminophen (TYLENOL) tablet 650 mg  650 mg Oral Q4H PRN Rise Patience, PA      . ibuprofen (ADVIL,MOTRIN) tablet 600 mg  600 mg Oral Q8H PRN Rise Patience, PA   600 mg at 10/01/11 1004  . LORazepam (ATIVAN) tablet 1 mg  1 mg Oral Q8H PRN Rise Patience, PA      . ondansetron [facility]) tablet 4 mg  4 mg Oral Q8H PRN Rise Patience, PA      . zolpidem (AMBIEN) tablet 5 mg  5 mg Oral QHS PRN Rise Patience, PA       Medications Prior to Admission  Medication Sig Dispense Refill  . ibuprofen (ADVIL,MOTRIN) 200 MG tablet Take 600 mg by mouth every 6 (six) hours as needed. For fever/Pain         OB/GYN Status:  No LMP for male patient.  General Assessment Data Location of Assessment: [facility] Assessment: Yes Living Arrangements: Alone Can pt return to current living arrangement?: Yes Admission Status: Voluntary Is patient capable of signing voluntary admission?: Yes Transfer from: Home Referral Source: Self/Family/Friend  Education Status Is patient currently in school?:  No Current Grade: NA Highest grade of school patient has completed: NA Name of school: NA Contact person: NA  Risk to self Suicidal Ideation: No Suicidal Intent: No Is patient at risk for suicide?: No Suicidal Plan?: No Access to Means: No What has been your use of drugs/alcohol within the last 12 months?:  (ETOH, Heroin) Previous Attempts/Gestures: No How many times?: 0  Other Self Harm Risks: NA Triggers for Past Attempts: None known Family Suicide History: No Recent stressful life event(s): Job Loss;Financial Problems;Other (Comment) (Did not pay rent this month; money ran out) Persecutory voices/beliefs?: No Depression: Yes Depression Symptoms: Despondent;Insomnia;Isolating;Guilt;Loss of interest in usual pleasures;Feeling  worthless/self pity Substance abuse history and/or treatment for substance abuse?: Yes Suicide prevention information given to non-admitted patients: Not applicable  Risk to Others Homicidal Ideation: No Thoughts of Harm to Others: No Current Homicidal Intent: No Current Homicidal Plan: No Access to Homicidal Means: No Identified Victim: NA History of harm to others?: No Assessment of Violence: None Noted Violent Behavior Description: NA Does patient have access to weapons?: No Criminal Charges Pending?: No Does patient have a court date: No  Psychosis Hallucinations: None noted Delusions: None noted  Mental Status Report Appear/Hygiene: Other (Comment) (Appropriate) Eye Contact: Fair Motor Activity: Freedom of movement Speech: Logical/coherent Level of Consciousness: Alert Mood: Depressed Affect: Depressed Anxiety Level: None Thought Processes: Coherent;Relevant Judgement: Unimpaired Orientation: Person;Place;Time;Situation Obsessive Compulsive Thoughts/Behaviors: None  Cognitive Functioning Concentration: Normal Memory: Recent Intact;Remote Intact IQ: Average Insight: Fair Impulse Control: Poor Appetite: Poor Weight Loss:  (Pt. reports 35 lb. loss in past 3 weeks) Weight Gain: 0  Sleep: Decreased Total Hours of Sleep: 2  Vegetative Symptoms: None  Prior Inpatient Therapy Prior Inpatient Therapy: Yes Prior Therapy Dates:  (2009, 2011, 2013) Prior Therapy Facilty/Provider(s):  ([facility], [facility], [facility], [facility]) Reason for Treatment:  (Detox, Substance abuse)  Prior Outpatient Therapy Prior Outpatient Therapy: Yes Prior Therapy Dates:  (Ongoing) Prior Therapy [facility]ilty/Provider([facility]): [facility] Reason for Treatment:  (Substance Abuse)            Values / Beliefs Cultural Requests During Hospitalization: None Spiritual Requests During Hospitalization: None        Additional Information 1:1 In Past 12 Months?: No CIRT Risk: No Elopement Risk:  No Does patient have medical clearance?: Yes     Disposition:  Disposition Disposition of Patient: Inpatient treatment program Type of inpatient treatment program: Adult  On Site Evaluation by:   Reviewed with Physician:     Bradly Bienenstock  SW Intern 10/01/2011 2:31 PM

## 2011-10-01 NOTE — ED Provider Notes (Signed)
History     CSN: [id number]  Arrival date & time 10/01/11  4782   First MD Initiated Contact with Patient 10/01/11 [phone]      Chief Complaint  Patient presents with  . Addiction Problem    (Consider location/radiation/quality/duration/timing/severity/associated sxs/prior treatment) HPI Comments: The patient reports he has been using heroin for the past 2-3 months.  Last use was 8:30pm yesterday.  Patient previously had problems with substance abuse and was sober for a year and a half until he started dating someone he met in rehab and they both relapsed.  Over the past 2-3 months, patient has lost 35-40 lbs, has overdosed (been unconscious for long periods of time after using heroin), and is now ready to get his life back together again.  States he has tried to quit on his own but when he develops N/V, cramps, sweats, he breaks down and uses again to get rid of that feeling.  Patient has recently lost his job and has had some issues with his family.  States he has a history of anxiety.  Denies depression, SI, HI.  Denies current alcohol use or other drug use.   The history is provided by the patient.    Past Medical History  Diagnosis Date  . Pneumothorax   . Shingles     Past Surgical History  Procedure Date  . Hernia repair     History reviewed. No pertinent family history.  History  Substance Use Topics  . Smoking status: Current Everyday Smoker  . Smokeless tobacco: Not on file  . Alcohol Use: No     occasionally      Review of Systems  Constitutional: Positive for unexpected weight change.  Respiratory: Negative for shortness of breath.   Cardiovascular: Negative for chest pain.  Gastrointestinal: Negative for abdominal pain.  Musculoskeletal: Negative for myalgias.  All other systems reviewed and are negative.    Allergies  Review of patient's allergies indicates no known allergies.  Home Medications   Current Outpatient Rx  Name Route Sig Dispense  Refill  . IBUPROFEN 200 MG PO TABS Oral Take 600 mg by mouth every 6 (six) hours as needed. For fever/Pain     . PROMETHAZINE HCL 25 MG PO TABS Oral Take 25 mg by mouth every 6 (six) hours as needed. Stomach ulcers      BP 122/74  Pulse 83  Temp(Src) 98.2 F (36.8 C) (Oral)  Resp 18  Wt 153 lb 3.2 oz (69.491 kg)  SpO2 100%  Physical Exam  Nursing note and vitals reviewed. Constitutional: He is oriented to person, place, and time. He appears well-developed and well-nourished. No distress.  HENT:  Head: Normocephalic and atraumatic.  Neck: Neck supple.  Cardiovascular: Normal rate, regular rhythm and normal heart sounds.   Pulmonary/Chest: Effort normal and breath sounds normal. No respiratory distress. He has no wheezes. He has no rales. He exhibits no tenderness.  Abdominal: Soft. Bowel sounds are normal. He exhibits no distension and no mass. There is generalized tenderness. There is no rebound and no guarding.  Musculoskeletal: Normal range of motion. He exhibits no edema and no tenderness.       Distal pulses intact  Neurological: He is alert and oriented to person, place, and time.  Skin: He is not diaphoretic.  Psychiatric: He has a normal mood and affect. His behavior is normal. Judgment and thought content normal.    ED Course  Procedures (including critical care time)  Labs Reviewed  CBC -  Abnormal; Notable for the following:    RBC 4.13 (*)    Hemoglobin 12.8 (*)    HCT 34.7 (*)    MCHC 36.9 (*)    All other components within normal limits  COMPREHENSIVE METABOLIC PANEL - Abnormal; Notable for the following:    Sodium 134 (*)    All other components within normal limits  URINE RAPID DRUG SCREEN (HOSP PERFORMED) - Abnormal; Notable for the following:    Opiates POSITIVE (*)    All other components within normal limits  ETHANOL   No results found.  7:56 AM Discussed patient with Aurther Loft from [facility] team who will see patient and evaluate for placement.    1.  Heroin addiction       MDM  Patient presents to ED for assistance with detox from heroin.  Pt has been using 2-3 months, has attempted to quit on his own without success.  Has connection with sponsor who will help him once he gets through the detox phase.  Patient to be seen by [facility] for placement.       3:47 PM Discussed patient with Dr Weldon Inches at change of shift.  Patient is pending assessment by [facility].    Rise Patience, [place] 10/01/11 0949  Rise Patience, [place] 10/01/11 [phone]

## 2011-10-01 NOTE — BH Assessment (Signed)
This assessment/evaluation/note was completed by a Child psychotherapist of Social Work Warden/ranger and as Investment banker, corporate, I was immediately available for consultation/collaboration. I am in agreement with the contents and disposition reflected in the assessment/evaluation/note(s).   Manson Passey Darrly Loberg ANN S , MSW, LCSWA 10/01/2011  3:08 PM [phone]

## 2011-10-01 NOTE — ED Provider Notes (Signed)
Pt was seen by sw and accepted at [facility].  He has transportation to get there  Cheri Guppy, MD 10/01/11 1657

## 2011-10-01 NOTE — ED Notes (Signed)
[place], [place] in to see pt

## 2011-10-01 NOTE — ED Notes (Signed)
Pt wants help detoxing from heroin, he states that hes overdosed a few times in the past few weeks

## 2011-10-01 NOTE — ED Notes (Signed)
Pt placed in paper scrubs by previous shift, pt has 1 large red duffle bag, possessions & pt have been wanded by [employer].

## 2011-10-01 NOTE — ED Notes (Addendum)
Pt has been accepted for detox at [facility] by Larita Fife. Pt's [facility] authorization# is: [id number] and TAR#: [id number] for dates 10/01/11-10/03/11 with the next review on 10/03/11. Pt is able to provide his own transportation. No further needs identified at this time.   CSW also provided pt with information on SA rehab programs (which included [facility] and [facility]) to follow up with after the detox process along with outpatient SA resources and [place] [facility]. No further CSW needs identified at this time.

## 2011-10-02 ENCOUNTER — Emergency Department: Payer: Self-pay | Admitting: Emergency Medicine

## 2011-10-02 LAB — BASIC METABOLIC PANEL
Anion Gap: 12 (ref 7–16)
BUN: 9 mg/dL (ref 7–18)
Calcium, Total: 8.7 mg/dL (ref 8.5–10.1)
Chloride: 110 mmol/L — ABNORMAL HIGH (ref 98–107)
Co2: 24 mmol/L (ref 21–32)
Creatinine: 1.07 mg/dL (ref 0.60–1.30)
EGFR (African American): >60
EGFR (Non-African Amer.): >60
Glucose: 122 mg/dL — ABNORMAL HIGH (ref 65–99)
Osmolality: 291 (ref 275–301)
Potassium: 3.9 mmol/L (ref 3.5–5.1)
Sodium: 146 mmol/L — ABNORMAL HIGH (ref 136–145)

## 2011-10-02 LAB — CBC
HCT: 38 % — ABNORMAL LOW (ref 40.0–52.0)
HGB: 13.2 g/dL (ref 13.0–18.0)
MCH: 30.7 pg (ref 26.0–34.0)
MCV: 88 fL (ref 80–100)
RBC: 4.31 10*6/uL — ABNORMAL LOW (ref 4.40–5.90)
RDW: 12.6 % (ref 11.5–14.5)
WBC: 6.2 10*3/uL (ref 3.8–10.6)

## 2011-10-02 NOTE — ED Provider Notes (Signed)
Medical screening examination/treatment/procedure(s) were performed by non-physician practitioner and as supervising physician I was immediately available for consultation/collaboration.   Vida Roller, MD 10/02/11 [phone]

## 2012-01-14 ENCOUNTER — Emergency Department (HOSPITAL_COMMUNITY)
Admission: EM | Admit: 2012-01-14 | Discharge: 2012-01-14 | Discharge status: Against Medical Advice | Payer: Self-pay | Attending: Emergency Medicine | Admitting: Emergency Medicine

## 2012-01-14 DIAGNOSIS — R69 Illness, unspecified: Secondary | ICD-10-CM | POA: Insufficient documentation

## 2012-02-16 ENCOUNTER — Encounter (HOSPITAL_COMMUNITY): Payer: Self-pay | Admitting: Family Medicine

## 2012-02-16 ENCOUNTER — Emergency Department (HOSPITAL_COMMUNITY)
Admission: EM | Admit: 2012-02-16 | Discharge: 2012-02-17 | Disposition: A | Discharge status: Other Acute Inpatient Hospital | Payer: Self-pay | Attending: Emergency Medicine | Admitting: Emergency Medicine

## 2012-02-16 DIAGNOSIS — R45851 Suicidal ideations: Secondary | ICD-10-CM | POA: Insufficient documentation

## 2012-02-16 DIAGNOSIS — G4700 Insomnia, unspecified: Secondary | ICD-10-CM | POA: Insufficient documentation

## 2012-02-16 DIAGNOSIS — F329 Major depressive disorder, single episode, unspecified: Secondary | ICD-10-CM | POA: Insufficient documentation

## 2012-02-16 DIAGNOSIS — F1910 Other psychoactive substance abuse, uncomplicated: Secondary | ICD-10-CM | POA: Insufficient documentation

## 2012-02-16 DIAGNOSIS — F3289 Other specified depressive episodes: Secondary | ICD-10-CM | POA: Insufficient documentation

## 2012-02-16 LAB — COMPREHENSIVE METABOLIC PANEL
ALT: 13 U/L (ref 0–53)
AST: 15 U/L (ref 0–37)
CO2: 24 mEq/L (ref 19–32)
Calcium: 9.8 mg/dL (ref 8.4–10.5)
Creatinine, Ser: 0.73 mg/dL (ref 0.50–1.35)
GFR calc Af Amer: >90 mL/min (ref >90)
GFR calc non Af Amer: >90 mL/min (ref >90)
Sodium: 139 mEq/L (ref 135–145)
Total Protein: 7.7 g/dL (ref 6.0–8.3)

## 2012-02-16 LAB — CBC WITH DIFFERENTIAL/PLATELET
Basophils Absolute: 0 10*3/uL (ref 0.0–0.1)
Eosinophils Absolute: 0.1 10*3/uL (ref 0.0–0.7)
Eosinophils Relative: 1 % (ref 0–5)
HCT: 37.3 % — ABNORMAL LOW (ref 39.0–52.0)
MCH: 30.1 pg (ref 26.0–34.0)
MCHC: 35.7 g/dL (ref 30.0–36.0)
MCV: 84.4 fL (ref 78.0–100.0)
Monocytes Absolute: 0.3 10*3/uL (ref 0.1–1.0)
Platelets: 259 10*3/uL (ref 150–400)
RDW: 11.5 % (ref 11.5–15.5)

## 2012-02-16 LAB — RAPID URINE DRUG SCREEN, HOSP PERFORMED
Amphetamines: NOT DETECTED
Cocaine: NOT DETECTED
Opiates: POSITIVE — AB
Tetrahydrocannabinol: NOT DETECTED

## 2012-02-16 LAB — ACETAMINOPHEN LEVEL: Acetaminophen (Tylenol), Serum: <15 ug/mL (ref 10–30)

## 2012-02-16 MED ORDER — LOPERAMIDE HCL 2 MG PO CAPS: 2.0000 mg | ORAL_CAPSULE | ORAL | Status: DC | PRN

## 2012-02-16 MED ORDER — NICOTINE 21 MG/24HR TD PT24
21.0000 mg | MEDICATED_PATCH | Freq: Every day | TRANSDERMAL | Status: DC
  Administered 2012-02-16: 21 mg via TRANSDERMAL
  Filled 2012-02-16: qty 1

## 2012-02-16 MED ORDER — ONDANSETRON HCL 8 MG PO TABS
4.0000 mg | ORAL_TABLET | Freq: Three times a day (TID) | ORAL | Status: DC | PRN
  Administered 2012-02-17: 4 mg via ORAL
  Filled 2012-02-16: qty 1

## 2012-02-16 MED ORDER — LORAZEPAM 1 MG PO TABS
1.0000 mg | ORAL_TABLET | Freq: Three times a day (TID) | ORAL | Status: DC | PRN
  Administered 2012-02-17: 1 mg via ORAL
  Filled 2012-02-16: qty 1

## 2012-02-16 MED ORDER — ZOLPIDEM TARTRATE 5 MG PO TABS
5.0000 mg | ORAL_TABLET | Freq: Every evening | ORAL | Status: DC | PRN
  Administered 2012-02-17: 5 mg via ORAL
  Filled 2012-02-16: qty 1

## 2012-02-16 MED ORDER — ACETAMINOPHEN 325 MG PO TABS
650.0000 mg | ORAL_TABLET | ORAL | Status: DC | PRN
  Administered 2012-02-17: 650 mg via ORAL
  Filled 2012-02-16: qty 2

## 2012-02-16 MED ORDER — IBUPROFEN 200 MG PO TABS: 600.0000 mg | ORAL_TABLET | Freq: Three times a day (TID) | ORAL | Status: DC | PRN

## 2012-02-16 MED ORDER — CLONIDINE HCL 0.2 MG PO TABS
0.2000 mg | ORAL_TABLET | Freq: Two times a day (BID) | ORAL | Status: DC | PRN
  Administered 2012-02-17: 0.2 mg via ORAL
  Filled 2012-02-16: qty 1
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
## 2012-02-16 NOTE — ED Notes (Signed)
SITTER AT BEDSIDE . PT. SLEEPING WITH NO DISTRESS.

## 2012-02-16 NOTE — ED Notes (Signed)
Patient given dinner trays and is eating at this time. AC notified of need for a sitter.

## 2012-02-16 NOTE — ED Notes (Signed)
Per pt sts that he is here for detox from heroin and alcohol. sts  Last use this am when he tried to OD and kill himself. sts his sponsor found him. sts drinks about a pint to fifth of liquor a day.

## 2012-02-16 NOTE — ED Notes (Signed)
NT AT BEDSIDE SITTING WITH PT.

## 2012-02-16 NOTE — ED Provider Notes (Addendum)
History     CSN: [id number]  Arrival date & time 02/16/12  1737   None     Chief Complaint  Patient presents with  . Suicidal    (Consider location/radiation/quality/duration/timing/severity/associated sxs/prior treatment) Patient is a 31 y.o. male presenting with mental health disorder. The history is provided by the patient.  Mental Health Problem Primary symptoms comment: depression and suidical The current episode started 1 to 2 weeks ago. This is a new problem.  The onset of the illness is precipitated by a stressful event, emotional stress, alcohol abuse and drug abuse. The degree of incapacity that he is experiencing as a consequence of his illness is severe. Additional symptoms of the illness include insomnia and feelings of worthlessness. He admits to suicidal ideas. He does have a plan to commit suicide. Already injured self: thought he took a lethal injection of heroin last night. Risk factors that are present for mental illness include substance abuse.    Past Medical History  Diagnosis Date  . Pneumothorax   . Shingles     Past Surgical History  Procedure Date  . Hernia repair     History reviewed. No pertinent family history.  History  Substance Use Topics  . Smoking status: Current Everyday Smoker  . Smokeless tobacco: Not on file  . Alcohol Use: Yes     occasionally      Review of Systems  Psychiatric/Behavioral: Positive for suicidal ideas. The patient has insomnia.   All other systems reviewed and are negative.    Allergies  Review of patient's allergies indicates no known allergies.  Home Medications   Current Outpatient Rx  Name Route Sig Dispense Refill  . IBUPROFEN 200 MG PO TABS Oral Take 600 mg by mouth every 6 (six) hours as needed. For fever/Pain     . PROMETHAZINE HCL 25 MG PO TABS Oral Take 25 mg by mouth every 6 (six) hours as needed. Stomach ulcers      BP 119/68  Pulse 80  Temp 97.9 F (36.6 C) (Oral)  Resp 16  SpO2  98%  Physical Exam  Nursing note and vitals reviewed. Constitutional: He is oriented to person, place, and time. He appears well-developed and well-nourished. No distress.  HENT:  Head: Normocephalic and atraumatic.  Mouth/Throat: Oropharynx is clear and moist.  Eyes: Conjunctivae and EOM are normal. Pupils are equal, round, and reactive to light.  Neck: Normal range of motion. Neck supple.  Cardiovascular: Normal rate, regular rhythm and intact distal pulses.   No murmur heard. Pulmonary/Chest: Effort normal and breath sounds normal. No respiratory distress. He has no wheezes. He has no rales.  Abdominal: Soft. He exhibits no distension. There is no tenderness. There is no rebound and no guarding.  Musculoskeletal: Normal range of motion. He exhibits no edema and no tenderness.  Neurological: He is alert and oriented to person, place, and time.  Skin: Skin is warm and dry. No rash noted. No erythema.  Psychiatric: His behavior is normal. His affect is blunt. He exhibits a depressed mood. He expresses suicidal ideation. He expresses suicidal plans.    ED Course  Procedures (including critical care time)  Labs Reviewed  CBC WITH DIFFERENTIAL - Abnormal; Notable for the following:    HCT 37.3 (*)     All other components within normal limits  COMPREHENSIVE METABOLIC PANEL - Abnormal; Notable for the following:    Glucose, Bld 107 (*)     All other components within normal limits  ETHANOL -  Abnormal; Notable for the following:    Alcohol, Ethyl (B) 201 (*)     All other components within normal limits  URINE RAPID DRUG SCREEN (HOSP PERFORMED) - Abnormal; Notable for the following:    Opiates POSITIVE (*)     All other components within normal limits  ACETAMINOPHEN LEVEL   No results found.   1. Depression   2. Substance abuse   3. Suicidal intent       MDM   Patient with a history of substance abuse and suicidal. He states that he thought he took a lethal injection of  heroin last night but his sponsor found him at noon today and recommended that he come here. Patient is depressed and suicidal still. He states that because of his drug use he is not able to see his son and just is ready to be done with this world. Medical clearance labs pending. Patient was moved back to cycling or active evaluation and placement.        Gwyneth Sprout, MD 02/16/12 9811  Gwyneth Sprout, MD 02/16/12 9147  Gwyneth Sprout, MD 02/16/12 2309

## 2012-02-17 ENCOUNTER — Inpatient Hospital Stay (HOSPITAL_COMMUNITY)
Admission: EM | Admit: 2012-02-17 | Discharge: 2012-02-20 | DRG: 897 | Disposition: A | Discharge status: Home / Self Care | Payer: Federal, State, Local not specified - Other | Source: Ambulatory Visit | Attending: Psychiatry | Admitting: Psychiatry

## 2012-02-17 ENCOUNTER — Encounter (HOSPITAL_COMMUNITY): Payer: Self-pay | Admitting: *Deleted

## 2012-02-17 DIAGNOSIS — F1020 Alcohol dependence, uncomplicated: Secondary | ICD-10-CM | POA: Diagnosis present

## 2012-02-17 DIAGNOSIS — K279 Peptic ulcer, site unspecified, unspecified as acute or chronic, without hemorrhage or perforation: Secondary | ICD-10-CM | POA: Diagnosis present

## 2012-02-17 DIAGNOSIS — F1994 Other psychoactive substance use, unspecified with psychoactive substance-induced mood disorder: Principal | ICD-10-CM | POA: Diagnosis present

## 2012-02-17 DIAGNOSIS — F1120 Opioid dependence, uncomplicated: Secondary | ICD-10-CM | POA: Diagnosis present

## 2012-02-17 DIAGNOSIS — K219 Gastro-esophageal reflux disease without esophagitis: Secondary | ICD-10-CM | POA: Diagnosis present

## 2012-02-17 DIAGNOSIS — I10 Essential (primary) hypertension: Secondary | ICD-10-CM | POA: Diagnosis present

## 2012-02-17 LAB — TSH: TSH: 0.311 u[IU]/mL — ABNORMAL LOW (ref 0.350–4.500)

## 2012-02-17 MED ORDER — ONDANSETRON 4 MG PO TBDP
4.0000 mg | ORAL_TABLET | Freq: Four times a day (QID) | ORAL | Status: AC | PRN
  Administered 2012-02-19: 4 mg via ORAL
  Filled 2012-02-17: qty 1

## 2012-02-17 MED ORDER — HYDROXYZINE HCL 25 MG PO TABS: 25.0000 mg | ORAL_TABLET | Freq: Four times a day (QID) | ORAL | Status: DC | PRN

## 2012-02-17 MED ORDER — TRAZODONE HCL 50 MG PO TABS: 50.0000 mg | ORAL_TABLET | Freq: Every evening | ORAL | Status: DC | PRN

## 2012-02-17 MED ORDER — ALUM & MAG HYDROXIDE-SIMETH 200-200-20 MG/5ML PO SUSP: 30.0000 mL | ORAL | Status: DC | PRN

## 2012-02-17 MED ORDER — CHLORDIAZEPOXIDE HCL 25 MG PO CAPS
25.0000 mg | ORAL_CAPSULE | Freq: Four times a day (QID) | ORAL | Status: AC
  Administered 2012-02-17 (×4): 25 mg via ORAL
  Filled 2012-02-17 (×4): qty 1

## 2012-02-17 MED ORDER — CHLORDIAZEPOXIDE HCL 25 MG PO CAPS
25.0000 mg | ORAL_CAPSULE | ORAL | Status: AC
  Administered 2012-02-19 (×2): 25 mg via ORAL
  Filled 2012-02-17 (×2): qty 1

## 2012-02-17 MED ORDER — ONDANSETRON 4 MG PO TBDP: 4.0000 mg | ORAL_TABLET | Freq: Four times a day (QID) | ORAL | Status: DC | PRN

## 2012-02-17 MED ORDER — CHLORDIAZEPOXIDE HCL 25 MG PO CAPS
25.0000 mg | ORAL_CAPSULE | Freq: Three times a day (TID) | ORAL | Status: AC
  Administered 2012-02-18 (×3): 25 mg via ORAL
  Filled 2012-02-17 (×3): qty 1

## 2012-02-17 MED ORDER — DICYCLOMINE HCL 20 MG PO TABS
20.0000 mg | ORAL_TABLET | Freq: Four times a day (QID) | ORAL | Status: DC | PRN
  Administered 2012-02-19: 20 mg via ORAL
  Filled 2012-02-17 (×2): qty 1

## 2012-02-17 MED ORDER — OMEPRAZOLE MAGNESIUM 20 MG PO TBEC
20.0000 mg | DELAYED_RELEASE_TABLET | Freq: Every day | ORAL | Status: DC | PRN
  Filled 2012-02-17: qty 1

## 2012-02-17 MED ORDER — CLONIDINE HCL 0.1 MG PO TABS
0.1000 mg | ORAL_TABLET | Freq: Four times a day (QID) | ORAL | Status: AC
  Administered 2012-02-17 – 2012-02-18 (×6): 0.1 mg via ORAL
  Filled 2012-02-17 (×6): qty 1

## 2012-02-17 MED ORDER — CHLORDIAZEPOXIDE HCL 25 MG PO CAPS
25.0000 mg | ORAL_CAPSULE | Freq: Every day | ORAL | Status: AC
  Administered 2012-02-20: 25 mg via ORAL
  Filled 2012-02-17: qty 1

## 2012-02-17 MED ORDER — NAPROXEN 500 MG PO TABS
500.0000 mg | ORAL_TABLET | Freq: Two times a day (BID) | ORAL | Status: DC | PRN
  Administered 2012-02-19: 500 mg via ORAL

## 2012-02-17 MED ORDER — LOPERAMIDE HCL 2 MG PO CAPS: 2.0000 mg | ORAL_CAPSULE | ORAL | Status: DC | PRN

## 2012-02-17 MED ORDER — HALOPERIDOL 5 MG PO TABS
5.0000 mg | ORAL_TABLET | Freq: Four times a day (QID) | ORAL | Status: DC | PRN
  Administered 2012-02-17 – 2012-02-18 (×2): 5 mg via ORAL
  Filled 2012-02-17: qty 1

## 2012-02-17 MED ORDER — PANTOPRAZOLE SODIUM 40 MG PO TBEC
40.0000 mg | DELAYED_RELEASE_TABLET | Freq: Two times a day (BID) | ORAL | Status: DC
  Administered 2012-02-17 – 2012-02-20 (×6): 40 mg via ORAL
  Filled 2012-02-17 (×10): qty 1

## 2012-02-17 MED ORDER — ENSURE COMPLETE PO LIQD
237.0000 mL | Freq: Two times a day (BID) | ORAL | Status: DC
  Administered 2012-02-17 – 2012-02-19 (×4): 237 mL via ORAL

## 2012-02-17 MED ORDER — TRAZODONE HCL 50 MG PO TABS
50.0000 mg | ORAL_TABLET | Freq: Every evening | ORAL | Status: DC | PRN
  Filled 2012-02-17 (×4): qty 1

## 2012-02-17 MED ORDER — HALOPERIDOL LACTATE 5 MG/ML IJ SOLN
5.0000 mg | Freq: Four times a day (QID) | INTRAMUSCULAR | Status: DC | PRN
  Filled 2012-02-17: qty 1

## 2012-02-17 MED ORDER — NICOTINE 21 MG/24HR TD PT24
21.0000 mg | MEDICATED_PATCH | Freq: Every day | TRANSDERMAL | Status: DC
  Administered 2012-02-17 – 2012-02-19 (×3): 21 mg via TRANSDERMAL
  Filled 2012-02-17 (×6): qty 1

## 2012-02-17 MED ORDER — TRAZODONE HCL 50 MG PO TABS
ORAL_TABLET | ORAL | Status: AC
  Administered 2012-02-17: 50 mg
  Filled 2012-02-17: qty 1

## 2012-02-17 MED ORDER — METHOCARBAMOL 500 MG PO TABS
500.0000 mg | ORAL_TABLET | Freq: Three times a day (TID) | ORAL | Status: DC | PRN
  Administered 2012-02-17 – 2012-02-19 (×2): 500 mg via ORAL
  Filled 2012-02-17 (×3): qty 1

## 2012-02-17 MED ORDER — ACETAMINOPHEN 325 MG PO TABS
650.0000 mg | ORAL_TABLET | Freq: Four times a day (QID) | ORAL | Status: DC | PRN
  Administered 2012-02-18 – 2012-02-19 (×3): 650 mg via ORAL

## 2012-02-17 MED ORDER — HYDROXYZINE HCL 25 MG PO TABS
25.0000 mg | ORAL_TABLET | Freq: Four times a day (QID) | ORAL | Status: AC | PRN
  Administered 2012-02-17 – 2012-02-20 (×5): 25 mg via ORAL
  Filled 2012-02-17: qty 1

## 2012-02-17 MED ORDER — CHLORDIAZEPOXIDE HCL 25 MG PO CAPS
25.0000 mg | ORAL_CAPSULE | Freq: Four times a day (QID) | ORAL | Status: AC | PRN
  Administered 2012-02-17 – 2012-02-19 (×3): 25 mg via ORAL
  Filled 2012-02-17 (×3): qty 1

## 2012-02-17 MED ORDER — METHOCARBAMOL 500 MG PO TABS: 500.0000 mg | ORAL_TABLET | Freq: Three times a day (TID) | ORAL | Status: DC | PRN

## 2012-02-17 MED ORDER — HALOPERIDOL 5 MG PO TABS
ORAL_TABLET | ORAL | Status: AC
  Administered 2012-02-17: 5 mg via ORAL
  Filled 2012-02-17: qty 1

## 2012-02-17 MED ORDER — VITAMIN B-1 100 MG PO TABS
100.0000 mg | ORAL_TABLET | Freq: Every day | ORAL | Status: DC
  Administered 2012-02-18 – 2012-02-20 (×3): 100 mg via ORAL
  Filled 2012-02-17 (×4): qty 1

## 2012-02-17 MED ORDER — THIAMINE HCL 100 MG/ML IJ SOLN
100.0000 mg | Freq: Once | INTRAMUSCULAR | Status: AC
  Administered 2012-02-17: 100 mg via INTRAMUSCULAR

## 2012-02-17 MED ORDER — CLONIDINE HCL 0.1 MG PO TABS
0.1000 mg | ORAL_TABLET | ORAL | Status: DC
  Administered 2012-02-19 (×2): 0.1 mg via ORAL
  Filled 2012-02-17 (×4): qty 1

## 2012-02-17 MED ORDER — CLONIDINE HCL 0.1 MG PO TABS
0.1000 mg | ORAL_TABLET | Freq: Every day | ORAL | Status: DC
  Filled 2012-02-17: qty 1

## 2012-02-17 MED ORDER — NAPROXEN 500 MG PO TABS
500.0000 mg | ORAL_TABLET | Freq: Two times a day (BID) | ORAL | Status: DC | PRN
  Filled 2012-02-17 (×2): qty 1

## 2012-02-17 MED ORDER — MAGNESIUM HYDROXIDE 400 MG/5ML PO SUSP: 30.0000 mL | Freq: Every day | ORAL | Status: DC | PRN

## 2012-02-17 MED ORDER — DICYCLOMINE HCL 20 MG PO TABS: 20.0000 mg | ORAL_TABLET | Freq: Four times a day (QID) | ORAL | Status: DC | PRN

## 2012-02-17 MED ORDER — ADULT MULTIVITAMIN W/MINERALS CH
1.0000 | ORAL_TABLET | Freq: Every day | ORAL | Status: DC
  Administered 2012-02-17 – 2012-02-20 (×4): 1 via ORAL
  Filled 2012-02-17 (×5): qty 1
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
## 2012-02-17 NOTE — Progress Notes (Signed)
Nutrition Brief Note  Patient identified on the Malnutrition Screening Tool (MST) report for unintended weight loss and poor appetite, generating a score of 5.   Body mass index is 18.26 kg/(m^2). Pt meets criteria for underweight based on current BMI.   - Pt reports poor intake for the past 2 weeks, mostly just snacking on foods, and reports 40 pound unintended weight loss in the past 2 months. Pt thinks this is all r/t heroin and heavy alcohol abuse. Pt reports intake has improved since admission. Pt interested in trying Ensure for additional nutrition. Pt getting daily multivitamin and thiamine.   Intervention: Ensure Complete BID. Encouraged small frequent meals and snacks to improve appetite.   Pt meets criteria for severe PCM of chronic illness AEB <75% estimated energy intake for the past month with 21% weight loss in the past 2 months per pt statement. Past records confirm that pt has lost 16.6% weight loss in the past 8 months.   No further nutrition intervention indicated at this time.   Dietitian# [phone]  Wt Readings from Last 10 Encounters:  02/17/12 150 lb (68.04 kg)  10/01/11 153 lb 3.2 oz (69.491 kg)  06/25/11 180 lb (81.647 kg)

## 2012-02-17 NOTE — ED Notes (Signed)
REPORT GIVEN TO JENIFFER RN AT [facility] , WILL CALL SECURITY TO TRANSPORT PT.

## 2012-02-17 NOTE — [facility] Suicide Risk Assessment (Signed)
Suicide Risk Assessment  Admission Assessment     Demographic factors:  Assessment Details Time of Assessment: Admission Information Obtained From: Patient Current Mental Status:  Current Mental Status:  (Currently denies) Loss Factors:  Loss Factors: Decrease in vocational status;Financial problems / change in socioeconomic status;Legal issues Historical Factors:  Historical Factors: Prior suicide attempts;Family history of suicide;Family history of mental illness or substance abuse Risk Reduction Factors:  Risk Reduction Factors: Sense of responsibility to family;Positive social support  CLINICAL FACTORS:   Severe Anxiety and/or Agitation Depression:   Anhedonia Comorbid alcohol abuse/dependence Alcohol/Substance Abuse/Dependencies More than one psychiatric diagnosis Previous Psychiatric Diagnoses and Treatments Medical Diagnoses and Treatments/Surgeries  COGNITIVE FEATURES THAT CONTRIBUTE TO RISK:  None Noted.    Diagnosis:  Axis I:   Heroin Dependence.   Alcohol Dependence.   Substance Induced Mood Disorder. Axis II:  Deferred. Axis III: 1. Gastroesophageal Reflux Disease.   2. Hypertension.   3. Peptic Ulcer Disease. Axis IV: Chronic Substance Abuse Issues.  Unemployment.  Corporate treasurer. Axis V:  GAF at time of admission approximately 40.  Highest GAF in the last year approximately 65.    The patient was seen today and reports the following:   ADL's: Intact.  Sleep: The patient reports to having difficulty sleeping.  Appetite: The patient reports a poor appetite today.   Mild>(1-10) >Severe  Hopelessness (1-10): 3  Depression (1-10): 4-5  Anxiety (1-10): 8-9   Suicidal Ideation: The patient denies any suicidal ideations today.  Plan: No  Intent: No  Means: No   Homicidal Ideation: The patient denies any homicidal ideations today.  Plan: No  Intent: No.  Means: No   General Appearance/Behavior: The patient was cooperative today with this provider  but appeared to be going thru significant opioid withdrawal.  Eye Contact: Good.  Speech: Appropriate in rate and volume with no pressuring of speech noted today.  Motor Behavior: wnl.  Level of Consciousness: Alert and Oriented x 3.  Mental Status: Alert and Oriented x 3.  Mood: Moderately depressed.  Affect: Moderately Constricted.  Anxiety Level: Severe anxiety reported today.  Thought Process: wnl.  Thought Content: The patient denies any auditory or visual hallucinations today as well as any delusional thinking.  Perception: wnl.  Judgment: Fair to Good.  Insight: Fair to Good.  Cognition: Oriented to person, place and time.   Current Medications:     . chlordiazePOXIDE  25 mg Oral QID   Followed by  . chlordiazePOXIDE  25 mg Oral TID   Followed by  . chlordiazePOXIDE  25 mg Oral BH-qamhs   Followed by  . chlordiazePOXIDE  25 mg Oral Daily  . cloNIDine  0.1 mg Oral QID   Followed by  . cloNIDine  0.1 mg Oral BH-qamhs   Followed by  . cloNIDine  0.1 mg Oral QAC breakfast  . feeding supplement  237 mL Oral BID PC  . multivitamin with minerals  1 tablet Oral Daily  . nicotine  21 mg Transdermal Q0600  . pantoprazole  40 mg Oral BID AC  . thiamine  100 mg Intramuscular Once  . thiamine  100 mg Oral Daily   Review of Systems:  Neurological: No headaches, seizures or dizziness reported.  G.I.: The patient reports stomach cramps related to his opioid withdrawal.  Musculoskeletal: The patient reports muscle cramps related to his opioid withdrawal.   Time was spent today discussing with the patient his current symptoms. The patient states that he is having significant difficulty initiating  and maintaining sleep and reports a poor appetite. The patient reports moderate feelings of sadness, anhedonia and depressed mood and denies any current suicidal or homicidal ideations.  He also denies any auditory or visual hallucinations or delusional thinking and reports severe anxiety  symptoms.   Joseph Duarte states that he is being admitted for detox from alcohol and heroin.  He reports to drinking a 5th of alcohol per day and using at least a gram of heroin per day just prior to admission.  Treatment Plan Summary:  1. Daily contact with patient to assess and evaluate symptoms and progress in treatment.  2. Medication management  3. The patient will deny suicidal ideations or homicidal ideations for 48 hours prior to discharge and have a depression and anxiety rating of 3 or less. The patient will also deny any auditory or visual hallucinations or delusional thinking.  4. The patient will deny any symptoms of substance withdrawal at time of discharge.   Plan:  1. The patient will be started on a Librium Protocol to assist with his detox from Alcohol. 2. The patient will be started on a Clonidine Protocol to assist with his detox from Heroin. 3. The patient will be started on Protonix 40 mgs po BID-WC for GERD.  4. Laboratory Studies reviewed.  5. Will continue to monitor.   SUICIDE RISK:  Minimal: No identifiable suicidal ideation.  Patients presenting with no risk factors but with morbid ruminations; may be classified as minimal risk based on the severity of the depressive symptoms   Joseph Duarte 02/17/2012, 8:30 PM

## 2012-02-17 NOTE — Tx Team (Signed)
Initial Interdisciplinary Treatment Plan  PATIENT STRENGTHS: (choose at least two) Ability for insight Average or above average intelligence Capable of independent living Communication skills General fund of knowledge Motivation for treatment/growth Physical Health Supportive family/friends  PATIENT STRESSORS: Financial difficulties Legal issue Medication change or noncompliance Substance abuse   PROBLEM LIST: Problem List/Patient Goals Date to be addressed Date deferred Reason deferred Estimated date of resolution  Substance Abuse      Depression      SI                                           DISCHARGE CRITERIA:  Ability to meet basic life and health needs Improved stabilization in mood, thinking, and/or behavior Motivation to continue treatment in a less acute level of care Need for constant or close observation no longer present Reduction of life-threatening or endangering symptoms to within safe limits Safe-care adequate arrangements made Verbal commitment to aftercare and medication compliance Withdrawal symptoms are absent or subacute and managed without 24-hour nursing intervention  PRELIMINARY DISCHARGE PLAN: Attend 12-step recovery group Outpatient therapy Return to previous living arrangement  PATIENT/FAMIILY INVOLVEMENT: This treatment plan has been presented to and reviewed with the patient, Joseph Duarte.  The patient has been given the opportunity to ask questions and make suggestions.  Arturo Morton 02/17/2012, 4:12 AM

## 2012-02-17 NOTE — Progress Notes (Signed)
[facility] Group Notes: (Counselor/Nursing/MHT/Case Management/Adjunct) 02/17/2012   @  11:00am  Finding Balance in Life  Type of Therapy:  Group Therapy  Participation Level:  None  Participation Quality:  Drowsy  Affect:  Flat  Cognitive:  Appropriate  Insight:  None  Engagement in Group: None  Engagement in Therapy:  None  Modes of Intervention:  Support and Exploration  Summary of Progress/Problems: Joseph Duarte slept through most of group.  Joseph Duarte 02/17/2012   3:33 PM

## 2012-02-17 NOTE — Progress Notes (Signed)
Patient ID: Joseph Duarte, male   DOB: 1980-08-27, 31 y.o.   MRN: [id number] D-Patient reports poor sleep and low energy.  His ability to pay attention is poor and he is experiencing many detox symptoms.  He was able to sleep awhile this but was awakened from sleep by his muscles twitching.  He has been attending groups.  He did go to lunch.  He is mainly noticing that he feels anxious, has some abdominal cramping, is aching all over.  A-Supported and encouraged patient. R-Patient seems appreciative of help given.

## 2012-02-17 NOTE — ED Notes (Signed)
PT. TRANSPORTED TO [facility] WITH SECURITY AND SITTER IN STABLE CONDITION . AMBULATORY.

## 2012-02-17 NOTE — Progress Notes (Signed)
[facility] Group Notes:  (Counselor/Nursing/MHT/Case Management/Adjunct)  02/17/2012 3:06 PM  Type of Therapy:  Recovery Goals: The focus of this group is to identify appropriate goals for recovery and establish a plan to achieve them.  Participation Level:  Did Not Attend   Summary of Progress/Problems:Pt. Did not attend group.   Ardelle Park O 02/17/2012, 3:06 PM

## 2012-02-17 NOTE — ED Provider Notes (Signed)
  Physical Exam  BP 121/71  Pulse 76  Temp 97.9 F (36.6 C) (Oral)  Resp 14  SpO2 99%  Physical Exam  ED Course  Procedures  MDM Pt accepted at [facility] by Dr. Wynonia Lawman, MD 02/17/12 [phone]

## 2012-02-17 NOTE — Progress Notes (Signed)
Patient seen during d/c planning group.  He advised of admitting to hospital following a suicide attempt by OD on drugs.  Patient advising he has been unable to stop using heroin and ETOH.  He reports having been in several treatment programs and wants to go into a residential program at discharge.  He currently denies SI.  He endorses body aches and headache.  Patient signed consent for referral to both [facility] and [facility].  Writer spoke with Roe Coombs at [facility] who advised patient was scheduled for an admission assessment there today.  He is scheduled for an assessment next Wednesday at 8AM.  Patient advised.  He shared that if [facility] has an opening at time of discharge he would prefer to discharge there a two week program.

## 2012-02-17 NOTE — ED Notes (Signed)
MARCUS ( ACT) ADVISED NURSE THAT PT. WILL BE ACCEPTED TO [facility] , PT. ELECTRONICALLY SIGNED CONSENT TO TRANSFER.

## 2012-02-17 NOTE — H&P (Signed)
Psychiatric Admission Assessment Adult  Patient Identification:  Joseph Duarte Date of Evaluation:  02/17/2012 Chief Complaint:  Depressive Disorder History of Present Illness:  Time was spent today discussing with the patient his current symptoms. The patient states that he is having significant difficulty initiating and maintaining sleep and reports a poor appetite. The patient reports moderate feelings of sadness, anhedonia and depressed mood and denies any current suicidal or homicidal ideations. He also denies any auditory or visual hallucinations or delusional thinking and reports severe anxiety symptoms.  Mr. Prestage states that he is being admitted for detox from alcohol and heroin. He reports to drinking a 5th of alcohol per day and using at least a gram of heroin per day just prior to admission.   Past Medical History:   Past Medical History  Diagnosis Date  . Pneumothorax   . Shingles    Allergies:   Allergies  Allergen Reactions  . Bee Venom Anaphylaxis   PTA Medications: Prescriptions prior to admission  Medication Sig Dispense Refill  . omeprazole (PRILOSEC OTC) 20 MG tablet Take 20 mg by mouth daily as needed. For upset stomach       Consequences of Substance Abuse: Medical Consequences:   Family Consequences:   Withdrawal Symptoms:   Cramps Diarrhea Nausea Vomiting  Social History: Please see Social Work Assessment.  Family History:  No family history on file.  Mental Status Examination/Evaluation:  Demographic factors: Assessment Details  Time of Assessment: Admission  Information Obtained From: Patient   Current Mental Status: Current Mental Status: (Currently denies)  Loss Factors: Loss Factors: Decrease in vocational status;Financial problems / change in socioeconomic status;Legal issues  Historical Factors: Historical Factors: Prior suicide attempts;Family history of suicide;Family history of mental illness or substance abuse  Risk Reduction Factors:  Risk Reduction Factors: Sense of responsibility to family;Positive social support   CLINICAL FACTORS:  Severe Anxiety and/or Agitation  Depression: Anhedonia  Comorbid alcohol abuse/dependence  Alcohol/Substance Abuse/Dependencies  More than one psychiatric diagnosis  Previous Psychiatric Diagnoses and Treatments  Medical Diagnoses and Treatments/Surgeries   COGNITIVE FEATURES THAT CONTRIBUTE TO RISK:  None Noted.   Diagnosis:  Axis I:   Heroin Dependence.    Alcohol Dependence.    Substance Induced Mood Disorder.  Axis II:  Deferred.  Axis III:  1. Gastroesophageal Reflux Disease.    2. Hypertension.    3. Peptic Ulcer Disease.  Axis IV:  Chronic Substance Abuse Issues. Unemployment. Corporate treasurer.  Axis V:  GAF at time of admission approximately 40. Highest GAF in the last year approximately 65.   The patient was seen today and reports the following:   ADL's: Intact.  Sleep: The patient reports to having difficulty sleeping.  Appetite: The patient reports a poor appetite today.   Mild>(1-10) >Severe  Hopelessness (1-10): 3  Depression (1-10): 4-5  Anxiety (1-10): 8-9   Suicidal Ideation: The patient denies any suicidal ideations today.  Plan: No  Intent: No  Means: No   Homicidal Ideation: The patient denies any homicidal ideations today.  Plan: No  Intent: No.  Means: No   General Appearance/Behavior: The patient was cooperative today with this provider but appeared to be going thru significant opioid withdrawal.  Eye Contact: Good.  Speech: Appropriate in rate and volume with no pressuring of speech noted today.  Motor Behavior: wnl.  Level of Consciousness: Alert and Oriented x 3.  Mental Status: Alert and Oriented x 3.  Mood: Moderately depressed.  Affect: Moderately Constricted.  Anxiety Level:  Severe anxiety reported today.  Thought Process: wnl.  Thought Content: The patient denies any auditory or visual hallucinations today as well as any  delusional thinking.  Perception: wnl.  Judgment: Fair to Good.  Insight: Fair to Good.  Cognition: Oriented to person, place and time.   Current Medications:  .  chlordiazePOXIDE  25 mg  Oral  QID    Followed by   .  chlordiazePOXIDE  25 mg  Oral  TID    Followed by   .  chlordiazePOXIDE  25 mg  Oral  BH-qamhs    Followed by   .  chlordiazePOXIDE  25 mg  Oral  Daily   .  cloNIDine  0.1 mg  Oral  QID    Followed by   .  cloNIDine  0.1 mg  Oral  BH-qamhs    Followed by   .  cloNIDine  0.1 mg  Oral  QAC breakfast   .  feeding supplement  237 mL  Oral  BID PC   .  multivitamin with minerals  1 tablet  Oral  Daily   .  nicotine  21 mg  Transdermal  Q0600   .  pantoprazole  40 mg  Oral  BID AC   .  thiamine  100 mg  Intramuscular  Once   .  thiamine  100 mg  Oral  Daily    Current Medications:  Current Facility-Administered Medications  Medication Dose Route Frequency Provider Last Rate Last Dose  . acetaminophen (TYLENOL) tablet 650 mg  650 mg Oral Q6H PRN Kerry Hough, PA      . alum & mag hydroxide-simeth (MAALOX/MYLANTA) 200-200-20 MG/5ML suspension 30 mL  30 mL Oral Q4H PRN Kerry Hough, PA      . chlordiazePOXIDE (LIBRIUM) capsule 25 mg  25 mg Oral Q6H PRN Kerry Hough, PA   25 mg at 02/17/12 8295  . chlordiazePOXIDE (LIBRIUM) capsule 25 mg  25 mg Oral QID Kerry Hough, PA   25 mg at 02/17/12 1704   Followed by  . chlordiazePOXIDE (LIBRIUM) capsule 25 mg  25 mg Oral TID Kerry Hough, PA       Followed by  . chlordiazePOXIDE (LIBRIUM) capsule 25 mg  25 mg Oral BH-qamhs Kerry Hough, PA       Followed by  . chlordiazePOXIDE (LIBRIUM) capsule 25 mg  25 mg Oral Daily Kerry Hough, PA      . cloNIDine (CATAPRES) tablet 0.1 mg  0.1 mg Oral QID Curlene Labrum Emmylou Bieker, MD   0.1 mg at 02/17/12 1704   Followed by  . cloNIDine (CATAPRES) tablet 0.1 mg  0.1 mg Oral BH-qamhs Kennisha Qin D Calob Baskette, MD       Followed by  . cloNIDine (CATAPRES) tablet 0.1 mg  0.1 mg Oral QAC  breakfast Curlene Labrum Jorryn Casagrande, MD      . dicyclomine (BENTYL) tablet 20 mg  20 mg Oral Q6H PRN Kerry Hough, PA      . feeding supplement (ENSURE COMPLETE) liquid 237 mL  237 mL Oral BID PC Lavena Bullion, RD   237 mL at 02/17/12 1957  . hydrOXYzine (ATARAX/VISTARIL) tablet 25 mg  25 mg Oral Q6H PRN Kerry Hough, PA   25 mg at 02/17/12 1955  . loperamide (IMODIUM) capsule 2-4 mg  2-4 mg Oral PRN Kerry Hough, PA      . magnesium hydroxide (MILK OF MAGNESIA) suspension 30 mL  30 mL  Oral Daily PRN Kerry Hough, PA      . methocarbamol (ROBAXIN) tablet 500 mg  500 mg Oral Q8H PRN Kerry Hough, PA      . multivitamin with minerals tablet 1 tablet  1 tablet Oral Daily Kerry Hough, PA   1 tablet at 02/17/12 0831  . naproxen (NAPROSYN) tablet 500 mg  500 mg Oral BID PRN Kerry Hough, PA      . naproxen (NAPROSYN) tablet 500 mg  500 mg Oral BID PRN Curlene Labrum Loreli Debruler, MD      . nicotine (NICODERM CQ - dosed in mg/24 hours) patch 21 mg  21 mg Transdermal Q0600 Kerry Hough, PA   21 mg at 02/17/12 1610  . omeprazole (PRILOSEC OTC) EC tablet 20 mg  20 mg Oral Daily PRN Kerry Hough, PA      . ondansetron (ZOFRAN-ODT) disintegrating tablet 4 mg  4 mg Oral Q6H PRN Kerry Hough, PA      . pantoprazole (PROTONIX) EC tablet 40 mg  40 mg Oral BID AC Curlene Labrum Tysheka Fanguy, MD   40 mg at 02/17/12 1705  . thiamine (B-1) injection 100 mg  100 mg Intramuscular Once Kerry Hough, PA   100 mg at 02/17/12 9604  . thiamine (VITAMIN B-1) tablet 100 mg  100 mg Oral Daily Kerry Hough, PA      . DISCONTD: dicyclomine (BENTYL) tablet 20 mg  20 mg Oral Q6H PRN Ronny Bacon, MD      . DISCONTD: hydrOXYzine (ATARAX/VISTARIL) tablet 25 mg  25 mg Oral Q6H PRN Ronny Bacon, MD      . DISCONTD: loperamide (IMODIUM) capsule 2-4 mg  2-4 mg Oral PRN Ronny Bacon, MD      . DISCONTD: methocarbamol (ROBAXIN) tablet 500 mg  500 mg Oral Q8H PRN Ronny Bacon, MD      . DISCONTD: ondansetron  (ZOFRAN-ODT) disintegrating tablet 4 mg  4 mg Oral Q6H PRN Kerry Hough, PA      . DISCONTD: ondansetron (ZOFRAN-ODT) disintegrating tablet 4 mg  4 mg Oral Q6H PRN Ronny Bacon, MD       Facility-Administered Medications Ordered in Other Encounters  Medication Dose Route Frequency Provider Last Rate Last Dose  . DISCONTD: acetaminophen (TYLENOL) tablet 650 mg  650 mg Oral Q4H PRN Gwyneth Sprout, MD   650 mg at 02/17/12 0101  . DISCONTD: cloNIDine (CATAPRES) tablet 0.2 mg  0.2 mg Oral BID PRN Gwyneth Sprout, MD   0.2 mg at 02/17/12 0130  . DISCONTD: ibuprofen (ADVIL,MOTRIN) tablet 600 mg  600 mg Oral Q8H PRN Gwyneth Sprout, MD      . DISCONTD: loperamide (IMODIUM) capsule 2 mg  2 mg Oral PRN Gwyneth Sprout, MD      . DISCONTD: LORazepam (ATIVAN) tablet 1 mg  1 mg Oral Q8H PRN Gwyneth Sprout, MD   1 mg at 02/17/12 0130  . DISCONTD: nicotine (NICODERM CQ - dosed in mg/24 hours) patch 21 mg  21 mg Transdermal Daily Gwyneth Sprout, MD   21 mg at 02/16/12 2026  . DISCONTD: ondansetron (ZOFRAN) tablet 4 mg  4 mg Oral Q8H PRN Gwyneth Sprout, MD   4 mg at 02/17/12 0131  . DISCONTD: zolpidem (AMBIEN) tablet 5 mg  5 mg Oral QHS PRN Gwyneth Sprout, MD   5 mg at 02/17/12 0136   Review of Systems:  Neurological: No headaches, seizures or dizziness reported.  G.I.: The patient  reports stomach cramps related to his opioid withdrawal.  Musculoskeletal: The patient reports muscle cramps related to his opioid withdrawal.   Time was spent today discussing with the patient his current symptoms. The patient states that he is having significant difficulty initiating and maintaining sleep and reports a poor appetite. The patient reports moderate feelings of sadness, anhedonia and depressed mood and denies any current suicidal or homicidal ideations. He also denies any auditory or visual hallucinations or delusional thinking and reports severe anxiety symptoms.  Joseph Duarte states that he is being  admitted for detox from alcohol and heroin. He reports to drinking a 5th of alcohol per day and using at least a gram of heroin per day just prior to admission.   Treatment Plan Summary:  1. Daily contact with patient to assess and evaluate symptoms and progress in treatment.  2. Medication management  3. The patient will deny suicidal ideations or homicidal ideations for 48 hours prior to discharge and have a depression and anxiety rating of 3 or less. The patient will also deny any auditory or visual hallucinations or delusional thinking.  4. The patient will deny any symptoms of substance withdrawal at time of discharge.   Plan:  1. The patient will be started on a Librium Protocol to assist with his detox from Alcohol.  2. The patient will be started on a Clonidine Protocol to assist with his detox from Heroin.  3. The patient will be started on Protonix 40 mgs po BID-WC for GERD.  4. Laboratory Studies reviewed.  5. Will continue to monitor.   SUICIDE RISK:  Minimal: No identifiable suicidal ideation. Patients presenting with no risk factors but with morbid ruminations; may be classified as minimal risk based on the severity of the depressive symptoms  Physical Examination:  The patient was seen and evaluated in the Emergency Department prior to his admission to [facility]. Please see the ED notes for more details.   Westley Blass 8/27/20138:47 PM

## 2012-02-17 NOTE — Progress Notes (Signed)
Psychoeducational Group Note  Date:  02/17/2012 Time:  1000  Group Topic/Focus:  Therapeutic Activity- "Apples to Apples"  Participation Level: Did Not Attend  Participation Quality:  Not Applicable  Affect:  Not Applicable  Cognitive:  Not Applicable  Insight:  Not Applicable  Engagement in Group: Not Applicable  Additional Comments:  Pt did not attend group.   Sharyn Lull 02/17/2012, 11:01 AM

## 2012-02-18 MED ORDER — HALOPERIDOL LACTATE 5 MG/ML IJ SOLN
5.0000 mg | Freq: Once | INTRAMUSCULAR | Status: AC
  Administered 2012-02-18: 5 mg via INTRAMUSCULAR
  Filled 2012-02-18: qty 1

## 2012-02-18 MED ORDER — LORAZEPAM 1 MG PO TABS
ORAL_TABLET | ORAL | Status: AC
  Filled 2012-02-18: qty 2

## 2012-02-18 MED ORDER — TRAZODONE HCL 50 MG PO TABS
50.0000 mg | ORAL_TABLET | ORAL | Status: AC
  Administered 2012-02-18: 50 mg via ORAL

## 2012-02-18 MED ORDER — TRAZODONE HCL 50 MG PO TABS
75.0000 mg | ORAL_TABLET | Freq: Every day | ORAL | Status: DC
  Administered 2012-02-18 – 2012-02-19 (×2): 75 mg via ORAL
  Filled 2012-02-18 (×3): qty 1.5

## 2012-02-18 MED ORDER — LORAZEPAM 1 MG PO TABS
2.0000 mg | ORAL_TABLET | Freq: Once | ORAL | Status: AC
  Administered 2012-02-18: 2 mg via ORAL

## 2012-02-18 NOTE — Progress Notes (Addendum)
Pt did not attend d/c planning group on this date.  SW met with pt individually at this time.  SW met with pt during treatment team at this time.  Pt presents with drowsy affect and mood.  Pt states that he came to the hospital to get off of heroin and alcohol.  Pt states that he was using 1 gram of heroin a day for about 6-8 months.  Pt states that he was drinking 1 pint to 1/5 a day if using heroin.  Pt states that he's been to [facility] and [facility] 2 years ago and was able to stay clean and sober after that up until 8 months ago.  Pt states that he has also been to [facility] in 2009.  Pt states that he wants to go to further treatment, [facility] or [facility] after detox.  Pt is scheduled to go to [facility] on Wednesday.  If pt is stable before then and wants to go to [facility] still SW can refer pt there instead.  Pt states that he lives in [place] and has transportation home.  Pt rates depression and anxiety at a 9 and denies SI/HI.  SW will assess for appropriate referrals.  No further needs voiced by pt at this time.    Joseph Duarte, LCSWA 02/18/2012  9:52 AM

## 2012-02-18 NOTE — Progress Notes (Addendum)
Patient denies SI/HI and A/V hallucinations; patient reports that he needed medication for  sleep and his appetite is poor; energy level is low and ability to pay attention is improving; rates depression 4/10 and hopelessness at 3/10 and anxiety 7/10; has complaints of lightheadedness' dizziness and headaches and patient also appears to have a deficit of his fluids as evidenced by low bp and elevated pulse and other signs and symtpoms  Patient given fluids and encouraged to drink fluids and vitals assessed; patient given medication as prescribed per physician order; patient encouraged to express feelings and/or concerns and talk with staff appropriately; monitored q 15 minutes; encouraged to attend groups   Patient did not attend any  groups; patient interacts with peers and staff appropriately; patient is cooperative and did not attend  groups; patient reports relief of dizziness and lightheadedness and this writer will continue to monitor      Patient went outside for recreation therapy and per mental health the patient reported having some dizziness; patient went outside and assessed; patient pulse was regular and steady and his pulse was 114 and bp 96/60; patient was brought back to the unit and fluids given and bp reassessed and bp 101/65 and pulse 61
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
## 2012-02-18 NOTE — Progress Notes (Signed)
D: Pt tearful. Complaining of anxiety and agitation. Pt states that he cannot sleep. Pt states that he feels that his body will not stop twitching. A: Pt medicated. R: Pt was able to obtain some sleep. Unsure of complete sleep time at this moment.

## 2012-02-18 NOTE — Progress Notes (Signed)
[facility] Group Notes:  (Counselor/Nursing/MHT/Case Management/Adjunct)  02/18/2012 5:45 PM  Type of Therapy:  Group Therapy  Participation Level:  Did Not Attend; patient dealing with severe withdrawal symptoms  Clide Dales 02/18/2012, 5:45 PM

## 2012-02-18 NOTE — Tx Team (Signed)
Interdisciplinary Treatment Plan Update (Adult)  Date:  02/18/2012  Time Reviewed:  9:43 AM   Progress in Treatment: Attending groups: Yes Participating in groups:  Yes Taking medication as prescribed: Yes Tolerating medication:  Yes Family/Significant othe contact made:  Counselor assessing for appropriate contact Patient understands diagnosis:  Yes Discussing patient identified problems/goals with staff:  Yes Medical problems stabilized or resolved:  Yes Denies suicidal/homicidal ideation: Yes Issues/concerns per patient self-inventory:  None identified Other: N/A  New problem(s) identified: None Identified  Reason for Continuation of Hospitalization: Anxiety Depression Medication stabilization Withdrawal symptoms  Interventions implemented related to continuation of hospitalization: mood stabilization, medication monitoring and adjustment, group therapy and psycho education, safety checks q 15 mins  Additional comments: N/A  Estimated length of stay: 3-5 days  Discharge Plan: SW will assess for appropriate referrals.  New goal(s): N/A  Review of initial/current patient goals per problem list:    1.  Goal(s): Address substance use  Met:  No  Target date: by discharge  As evidenced by: completing detox protocol and refer to appropriate treatment  2.  Goal (s): Reduce depressive and anxiety symptoms  Met:  No  Target date: by discharge  As evidenced by: Reducing depression from a 10 to a 3 as reported by pt.    3.  Goal(s): Eliminate SI  Met:  No  Target date: by discharge  As evidenced by: Pt denying SI.    Attendees: Patient:  Joseph Duarte 02/18/2012 9:46 AM   Family:     Physician:  Lupe Carney, DO 02/18/2012 9:43 AM   Nursing: Leighton Parody, RN 02/18/2012 9:43 AM   Case Manager:  Reyes Ivan, LCSWA 02/18/2012  9:43 AM   Counselor:  Ronda Fairly, LCSWA 02/18/2012  9:43 AM   Other:  Richelle Ito, LCSW 02/18/2012 9:43 AM   Other:   Robbie Louis, RN 02/18/2012 9:46 AM   Other:     Other:      Scribe for Treatment Team:   Reyes Ivan 02/18/2012 9:43 AM

## 2012-02-18 NOTE — Progress Notes (Signed)
Psychoeducational Group Note  Date:  02/18/2012 Time: 2000  Group Topic/Focus:  AA group  Participation Level:  Active  Participation Quality:  Appropriate  Affect:  Appropriate  Cognitive:  Appropriate  Insight:  Good  Engagement in Group:  Good  Additional Comments:  Gwenevere Ghazi Patience 02/18/2012, 10:39 PM

## 2012-02-18 NOTE — Progress Notes (Signed)
Pt returned to window complaining of anxiety and insomnia. Pt states that he is unable to deal with this detox process. Pt tearful. Pt medicated with Librium and Trazodone.

## 2012-02-18 NOTE — Progress Notes (Signed)
[facility] MD Progress Note  02/18/2012 11:10 AM  S: "Duarte could not get up for breakfast this morning. The withdrawal symptoms were horrible last night. But Duarte di not have any seizures. Duarte did not sleep because Duarte was restless all night long. Duarte am very tired. Feel horrible. Duarte guess Duarte can say that Duarte am all right".  Diagnosis:   Axis Duarte: Substance Induced Mood Disorder and Opioid dependence, Alcohol dependence Axis II: Deferred Axis III:  Past Medical History  Diagnosis Date  . Pneumothorax   . Shingles    Axis IV: other psychosocial or environmental problems Axis V: 41-50 serious symptoms  ADL's:  Intact  Sleep: "Poor"  Appetite:  Poor  Suicidal Ideation: "None" Plan:  No Intent:  no Means:  No Homicidal Ideation: "No" Plan:  No Intent:  no Means:  no  AEB (as evidenced by): Per patient's reports.  Mental Status Examination/Evaluation: Objective:  Appearance: Casual  Eye Contact::  Fair  Speech:  Clear and Coherent  Volume:  Normal  Mood:  "Duarte'm tired"  Affect:  Flat  Thought Process:  Coherent  Orientation:  Full  Thought Content:  Rumination  Suicidal Thoughts:  No  Homicidal Thoughts:  No  Memory:  Immediate;   Good Recent;   Good Remote;   Good  Judgement:  Fair  Insight:  Good  Psychomotor Activity:  Restlessness  Concentration: Fair  Recall:  Good  Akathisia:  No  Handed:  Right  AIMS (if indicated):     Assets:  Desire for Improvement  Sleep:  Number of Hours: 1.25    Vital Signs:Blood pressure 101/69, pulse 128, temperature 98.1 F (36.7 C), temperature source Oral, resp. rate 16, height 6\' 4"  (1.93 m), weight 68.04 kg (150 lb). Current Medications: Current Facility-Administered Medications  Medication Dose Route Frequency Provider Last Rate Last Dose  . acetaminophen (TYLENOL) tablet 650 mg  650 mg Oral Q6H PRN Kerry Hough, PA      . alum & mag hydroxide-simeth (MAALOX/MYLANTA) 200-200-20 MG/5ML suspension 30 mL  30 mL Oral Q4H PRN Kerry Hough, PA       . chlordiazePOXIDE (LIBRIUM) capsule 25 mg  25 mg Oral Q6H PRN Kerry Hough, PA   25 mg at 02/18/12 0035  . chlordiazePOXIDE (LIBRIUM) capsule 25 mg  25 mg Oral QID Kerry Hough, PA   25 mg at 02/17/12 2148   Followed by  . chlordiazePOXIDE (LIBRIUM) capsule 25 mg  25 mg Oral TID Kerry Hough, PA   25 mg at 02/18/12 1610   Followed by  . chlordiazePOXIDE (LIBRIUM) capsule 25 mg  25 mg Oral BH-qamhs Kerry Hough, PA       Followed by  . chlordiazePOXIDE (LIBRIUM) capsule 25 mg  25 mg Oral Daily Kerry Hough, PA      . cloNIDine (CATAPRES) tablet 0.1 mg  0.1 mg Oral QID Curlene Labrum Readling, MD   0.1 mg at 02/18/12 9604   Followed by  . cloNIDine (CATAPRES) tablet 0.1 mg  0.1 mg Oral BH-qamhs Randy D Readling, MD       Followed by  . cloNIDine (CATAPRES) tablet 0.1 mg  0.1 mg Oral QAC breakfast Curlene Labrum Readling, MD      . dicyclomine (BENTYL) tablet 20 mg  20 mg Oral Q6H PRN Kerry Hough, PA      . feeding supplement (ENSURE COMPLETE) liquid 237 mL  237 mL Oral BID PC Lavena Bullion, RD  237 mL at 02/17/12 1957  . haloperidol (HALDOL) tablet 5 mg  5 mg Oral Q6H PRN Kerry Hough, PA   5 mg at 02/17/12 2337   Or  . haloperidol lactate (HALDOL) injection 5 mg  5 mg Intramuscular Q6H PRN Kerry Hough, PA      . haloperidol lactate (HALDOL) injection 5 mg  5 mg Intramuscular Once Kerry Hough, PA   5 mg at 02/18/12 0221  . hydrOXYzine (ATARAX/VISTARIL) tablet 25 mg  25 mg Oral Q6H PRN Kerry Hough, PA   25 mg at 02/18/12 0211  . loperamide (IMODIUM) capsule 2-4 mg  2-4 mg Oral PRN Kerry Hough, PA      . LORazepam (ATIVAN) 1 MG tablet           . LORazepam (ATIVAN) tablet 2 mg  2 mg Oral Once Kerry Hough, PA   2 mg at 02/18/12 7829  . magnesium hydroxide (MILK OF MAGNESIA) suspension 30 mL  30 mL Oral Daily PRN Kerry Hough, PA      . methocarbamol (ROBAXIN) tablet 500 mg  500 mg Oral Q8H PRN Kerry Hough, PA   500 mg at 02/17/12 2148  . multivitamin  with minerals tablet 1 tablet  1 tablet Oral Daily Kerry Hough, PA   1 tablet at 02/18/12 0841  . naproxen (NAPROSYN) tablet 500 mg  500 mg Oral BID PRN Kerry Hough, PA      . naproxen (NAPROSYN) tablet 500 mg  500 mg Oral BID PRN Curlene Labrum Readling, MD      . nicotine (NICODERM CQ - dosed in mg/24 hours) patch 21 mg  21 mg Transdermal Q0600 Kerry Hough, PA   21 mg at 02/18/12 5621  . omeprazole (PRILOSEC OTC) EC tablet 20 mg  20 mg Oral Daily PRN Kerry Hough, PA      . ondansetron (ZOFRAN-ODT) disintegrating tablet 4 mg  4 mg Oral Q6H PRN Kerry Hough, PA      . pantoprazole (PROTONIX) EC tablet 40 mg  40 mg Oral BID AC Curlene Labrum Readling, MD   40 mg at 02/18/12 0836  . thiamine (VITAMIN B-1) tablet 100 mg  100 mg Oral Daily Kerry Hough, PA   100 mg at 02/18/12 3086  . traZODone (DESYREL) 50 MG tablet        50 mg at 02/17/12 2352  . traZODone (DESYREL) tablet 50 mg  50 mg Oral QHS,MR X 1 Kerry Hough, PA      . traZODone (DESYREL) tablet 50 mg  50 mg Oral NOW Kerry Hough, PA   50 mg at 02/18/12 0035  . DISCONTD: dicyclomine (BENTYL) tablet 20 mg  20 mg Oral Q6H PRN Ronny Bacon, MD      . DISCONTD: hydrOXYzine (ATARAX/VISTARIL) tablet 25 mg  25 mg Oral Q6H PRN Ronny Bacon, MD      . DISCONTD: loperamide (IMODIUM) capsule 2-4 mg  2-4 mg Oral PRN Ronny Bacon, MD      . DISCONTD: methocarbamol (ROBAXIN) tablet 500 mg  500 mg Oral Q8H PRN Ronny Bacon, MD      . DISCONTD: ondansetron (ZOFRAN-ODT) disintegrating tablet 4 mg  4 mg Oral Q6H PRN Ronny Bacon, MD      . DISCONTD: traZODone (DESYREL) tablet 50 mg  50 mg Oral QHS,MR X 1 Kerry Hough, PA        Lab  Results:  Results for orders placed during the hospital encounter of 02/17/12 (from the past 48 hour(s))  TSH     Status: Abnormal   Collection Time   02/17/12  6:20 AM      Component Value Range Comment   TSH 0.311 (*) 0.350 - 4.500 uIU/mL   T4, FREE     Status: Normal   Collection Time     02/17/12  6:20 AM      Component Value Range Comment   Free T4 1.19  0.80 - 1.80 ng/dL     Physical Findings: AIMS: Facial and Oral Movements Muscles of Facial Expression: None, normal Lips and Perioral Area: None, normal Jaw: None, normal Tongue: None, normal,Extremity Movements Upper (arms, wrists, hands, fingers): None, normal Lower (legs, knees, ankles, toes): None, normal, Trunk Movements Neck, shoulders, hips: None, normal, Overall Severity Severity of abnormal movements (highest score from questions above): None, normal Incapacitation due to abnormal movements: None, normal Patient's awareness of abnormal movements (rate only patient's report): No Awareness, Dental Status Current problems with teeth and/or dentures?: Yes (holes in two lower teeth per his report.) Does patient usually wear dentures?: No  CIWA:  CIWA-Ar Total: 6  COWS:  COWS Total Score: 9   Treatment Plan Summary: Daily contact with patient to assess and evaluate symptoms and progress in treatment Medication management  Plan: Increase Trazodone from 50 mg to 75 mg Q bedtime for sleep. Continue to monitor for any seizure activities related to withdrawal symptoms. Encourage out of bed to [employer], AA/NA meetings and group counseling sessions. Continue current treatment plan.  Joseph Duarte 02/18/2012, 11:10 AM

## 2012-02-18 NOTE — Progress Notes (Signed)
D: Pt came to window complaining of increased anxiety, irritability, and the inability to sleep. Pt has very defined motor tremor. Pt denies SI/HI/AVH. A: Support and encouragement offered to pt. Pt medicated for anxiety and sleep. Continue Q 15 min checks. R: Pt receptive. Pt remains safe on the unit.

## 2012-02-18 NOTE — Progress Notes (Signed)
Psychoeducational Group Note  Date:  02/18/2012 Time:  1100  Group Topic/Focus:  Personal Choices and Values:   The focus of this group is to help patients assess and explore the importance of values in their lives, how their values affect their decisions, how they express their values and what opposes their expression.  Participation Level: Did Not Attend  Participation Quality:  Not Applicable  Affect:  Not Applicable  Cognitive:  Not Applicable  Insight:  Not Applicable  Engagement in Group: Not Applicable  Additional Comments:  Pt did not attend group, pt remained in bed.  Karleen Hampshire Brittini 02/18/2012, 2:49 PM

## 2012-02-18 NOTE — Progress Notes (Signed)
Patient ID: Joseph Duarte, male   DOB: [birth year], 31 y.o.   MRN: [id number]  D:  Pt was pleasant but anxious during the assessment. Stated that he's ok dealing with the "nasty stuff" referring to sweats, vomiting and hot and cold flashes. However, pt stated that muscle twitching is something that's almost impossible for him to deal with.  Stated that when the twitching happens he uses it for an excuse to "use again".    A:  Encouragement and support was offered. 15 min checks were continued for safety.  R: Pt remains safe.

## 2012-02-19 MED ORDER — DIPHENHYDRAMINE HCL 50 MG/ML IJ SOLN
INTRAMUSCULAR | Status: AC
  Administered 2012-02-19: 50 mg via INTRAMUSCULAR
  Filled 2012-02-19: qty 1

## 2012-02-19 MED ORDER — DIPHENHYDRAMINE HCL 50 MG/ML IJ SOLN
50.0000 mg | Freq: Once | INTRAMUSCULAR | Status: AC
  Administered 2012-02-19: 50 mg via INTRAMUSCULAR

## 2012-02-19 MED ORDER — BENZTROPINE MESYLATE 1 MG/ML IJ SOLN
2.0000 mg | Freq: Once | INTRAMUSCULAR | Status: AC
  Administered 2012-02-19: 2 mg via INTRAMUSCULAR

## 2012-02-19 MED ORDER — BENZTROPINE MESYLATE 1 MG/ML IJ SOLN
INTRAMUSCULAR | Status: AC
  Administered 2012-02-19: 2 mg via INTRAMUSCULAR
  Filled 2012-02-19: qty 2

## 2012-02-19 MED ORDER — DIPHENHYDRAMINE HCL 50 MG PO CAPS: 50.0000 mg | ORAL_CAPSULE | Freq: Once | ORAL | Status: AC

## 2012-02-19 NOTE — Progress Notes (Signed)
Psychoeducational Group Note  Date:  02/19/2012 Time:  1100  Group Topic/Focus:  Rediscovering Joy:   The focus of this group is to explore various ways to relieve stress in a positive manner.  Participation Level:  Active  Participation Quality:  Appropriate, Attentive and Sharing  Affect:  Appropriate  Cognitive:  Appropriate  Insight:  Good  Engagement in Group:  Good  Additional Comments:  Pt participated in rediscovering joy. Pt discussed ways that humor and laughter can have positive benefits for the mind and body. Pt also talked about ways that humor and joy can be apart of everyday life. Pt stated ways to receive joy. Activity was performed during group, on the five senses. Pt was asked within the five sense name one thing that would give joy. Example would be for hearing, listening to the birds chirp. Pt was sharing, cooperative during group.  Karleen Hampshire Brittini 02/19/2012, 1:53 PM

## 2012-02-19 NOTE — Progress Notes (Signed)
Currently resting quietly in bed with eyes closed. Respirations are even and unlabored. No acute distress noted. Safety has been maintained with Q15 minute observation. Will continue Q15 minute observation and continue current POC. 

## 2012-02-19 NOTE — Progress Notes (Signed)
02/19/2012         Time: 1500      Group Topic/Focus: The focus of this group is on enhancing the patient's understanding of leisure, barriers to leisure, and the importance of engaging in positive leisure activities upon discharge for improved total health.  Participation Level: Minimal  Participation Quality: Redirectable  Affect: Irritable  Cognitive: Oriented   Additional Comments: Patient had to be separated from a male peer who he couldn't stop talking to. Patient reports when he is discharged he is going to shave his son's head to make the child's father angry.    Arletta Lumadue 02/19/2012 3:40 PM

## 2012-02-19 NOTE — Progress Notes (Signed)
D: Pt. Became loud ,angry,cursing & demanding to leave -- seemed upset after a phone -call. Cows was 6. Pt.was making verbal threats towards someone on the outside. A: Pt. Was told that he could not just leave & was encouraged to address this with the Dr in AM.R: pt calmed down & stopped ranting & raving.Continues on 15 minute checks. R: Pt. Safety maintained.

## 2012-02-19 NOTE — Progress Notes (Signed)
Patient ID: Joseph Duarte, male   DOB: [birth year], 31 y.o.   MRN: [id number] He has been up and to groups interacting with peers  And staff.  Requested and receive prn for h/a at  13:20. He is in group at that time. Has had no s/s of EPS  seance he had cogentin and benadryl this AM.  Self inventory: depressed 2, hopeless 1, W/D's craving chilling and lighthedtness.  He has been pleasant and cooperative.
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
## 2012-02-19 NOTE — Progress Notes (Addendum)
[facility] Group Notes:  (Counselor/Nursing/MHT/Case Management/Adjunct)  02/19/2012 2:54 PM  Type of Therapy:  Group Therapy  Participation Level:  Active  Participation Quality:  Appropriate, Attentive, Sharing and Supportive  Affect:  Appropriate  Cognitive:  Appropriate  Insight:  Good  Engagement in Group:  Good  Engagement in Therapy:  Good  Modes of Intervention:  Problem-solving, Support and exploration  Summary of Progress/Problems:Pt attended group therapy to process feeling about finding balance in one's life. Pt explored what balance means and looks like in their individual lives, what imbalance looks like and how to adjustment from life of addiction to a life of recovery. Pt shared about his success with being sober for two years in his life and then how he did not balance freedom from structured treatment well and relapsed. Pt also shared about issues with the mother of his children and his rage towards her. Pt also shared about losing his mother at age 32 days and how his father has recently given up on him. Pt was encouraged to seek balance by taking his ownerback and being the [name] of his own life. Riyanna Crutchley, LPCA     Joseph Duarte 02/19/2012, 2:54 PM

## 2012-02-19 NOTE — Progress Notes (Signed)
[facility] MD Progress Note  02/19/2012 2:47 PM  S: "I am here for heroin detox. I had stopped calling my sponsor, including going to the meetings. I have not seen my young son in a month because his mother would not let me see him. I felt frustrated. So, I doped myself up, luckily my sponsor visited, found me slumped on the couch. He said to me, you are going to the hospital. And in other to get admitted for detox, he stopped at the liquor store on our way to the hospital, bought a bottle of liquor, and asked me to drink it. This is to assure me admission because I will have alcohol in my system. My muscles are longer tight after the injection of cogentin and Benadryl".  O: Earlier in the morning, patient was observed complaining of generalized muscle rigidity and tightness around mouth areas. He was medicated with cogentin 2 mg IM and Benadryl 50 mg IM for EPS.   Diagnosis:   Axis I: Substance Induced Mood Disorder and Opioid dependence, Alcohol dependence Axis II: Deferred Axis III:  Past Medical History  Diagnosis Date  . Pneumothorax   . Shingles    Axis IV: other psychosocial or environmental problems Axis V: 41-50 serious symptoms  ADL's:  Intact  Sleep: Good  Appetite:  Good  Suicidal Ideation: "No" Plan:  No Intent:  No Means:  no Homicidal Ideation:  Plan:  No Intent:  no Means:  no  AEB (as evidenced by): Per patient's reports.  Mental Status Examination/Evaluation: Objective:  Appearance: Casual and tall and thin  Eye Contact::  Good  Speech:  Clear and Coherent  Volume:  Normal  Mood:  "My mood is okay"  Affect:  Congruent  Thought Process:  Coherent and Intact  Orientation:  Full  Thought Content:  Rumination  Suicidal Thoughts:  No  Homicidal Thoughts:  No  Memory:  Immediate;   Good Recent;   Good Remote;   Good  Judgement:  Fair  Insight:  Fair  Psychomotor Activity:  Normal  Concentration:  Good  Recall:  Good  Akathisia:  No  Handed:  Right  AIMS  (if indicated):     Assets:  Desire for Improvement  Sleep:  Number of Hours: 3.75    Vital Signs:Blood pressure 114/76, pulse 112, temperature 98 F (36.7 C), temperature source Oral, resp. rate 18, height 6\' 4"  (1.93 m), weight 68.04 kg (150 lb). Current Medications: Current Facility-Administered Medications  Medication Dose Route Frequency Provider Last Rate Last Dose  . acetaminophen (TYLENOL) tablet 650 mg  650 mg Oral Q6H PRN Kerry Hough, PA   650 mg at 02/19/12 1320  . alum & mag hydroxide-simeth (MAALOX/MYLANTA) 200-200-20 MG/5ML suspension 30 mL  30 mL Oral Q4H PRN Kerry Hough, PA      . benztropine mesylate (COGENTIN) injection 2 mg  2 mg Intramuscular Once Mike Craze, MD   2 mg at 02/19/12 1013  . chlordiazePOXIDE (LIBRIUM) capsule 25 mg  25 mg Oral Q6H PRN Kerry Hough, PA   25 mg at 02/19/12 0119  . chlordiazePOXIDE (LIBRIUM) capsule 25 mg  25 mg Oral TID Kerry Hough, PA   25 mg at 02/18/12 1720   Followed by  . chlordiazePOXIDE (LIBRIUM) capsule 25 mg  25 mg Oral BH-qamhs Kerry Hough, PA   25 mg at 02/19/12 0813   Followed by  . chlordiazePOXIDE (LIBRIUM) capsule 25 mg  25 mg Oral Daily Kerry Hough,  PA      . cloNIDine (CATAPRES) tablet 0.1 mg  0.1 mg Oral QID Curlene Labrum Readling, MD   0.1 mg at 02/18/12 2243   Followed by  . cloNIDine (CATAPRES) tablet 0.1 mg  0.1 mg Oral BH-qamhs Randy D Readling, MD   0.1 mg at 02/19/12 0813   Followed by  . cloNIDine (CATAPRES) tablet 0.1 mg  0.1 mg Oral QAC breakfast Curlene Labrum Readling, MD      . dicyclomine (BENTYL) tablet 20 mg  20 mg Oral Q6H PRN Kerry Hough, PA   20 mg at 02/19/12 0813  . diphenhydrAMINE (BENADRYL) capsule 50 mg  50 mg Oral Once Mike Craze, MD       Or  . diphenhydrAMINE (BENADRYL) injection 50 mg  50 mg Intramuscular Once Mike Craze, MD   50 mg at 02/19/12 0915  . feeding supplement (ENSURE COMPLETE) liquid 237 mL  237 mL Oral BID PC Lavena Bullion, RD   237 mL at 02/19/12 1445  .  hydrOXYzine (ATARAX/VISTARIL) tablet 25 mg  25 mg Oral Q6H PRN Kerry Hough, PA   25 mg at 02/19/12 0118  . loperamide (IMODIUM) capsule 2-4 mg  2-4 mg Oral PRN Kerry Hough, PA      . magnesium hydroxide (MILK OF MAGNESIA) suspension 30 mL  30 mL Oral Daily PRN Kerry Hough, PA      . methocarbamol (ROBAXIN) tablet 500 mg  500 mg Oral Q8H PRN Kerry Hough, PA   500 mg at 02/19/12 0211  . multivitamin with minerals tablet 1 tablet  1 tablet Oral Daily Kerry Hough, PA   1 tablet at 02/19/12 0813  . naproxen (NAPROSYN) tablet 500 mg  500 mg Oral BID PRN Kerry Hough, PA      . naproxen (NAPROSYN) tablet 500 mg  500 mg Oral BID PRN Curlene Labrum Readling, MD      . nicotine (NICODERM CQ - dosed in mg/24 hours) patch 21 mg  21 mg Transdermal Q0600 Kerry Hough, PA   21 mg at 02/19/12 0626  . omeprazole (PRILOSEC OTC) EC tablet 20 mg  20 mg Oral Daily PRN Kerry Hough, PA      . ondansetron (ZOFRAN-ODT) disintegrating tablet 4 mg  4 mg Oral Q6H PRN Kerry Hough, PA   4 mg at 02/19/12 0813  . pantoprazole (PROTONIX) EC tablet 40 mg  40 mg Oral BID AC Curlene Labrum Readling, MD   40 mg at 02/19/12 0626  . thiamine (VITAMIN B-1) tablet 100 mg  100 mg Oral Daily Kerry Hough, PA   100 mg at 02/19/12 0813  . traZODone (DESYREL) tablet 75 mg  75 mg Oral QHS Sanjuana Kava, NP   75 mg at 02/18/12 2243  . DISCONTD: haloperidol (HALDOL) tablet 5 mg  5 mg Oral Q6H PRN Kerry Hough, PA   5 mg at 02/18/12 2245  . DISCONTD: haloperidol lactate (HALDOL) injection 5 mg  5 mg Intramuscular Q6H PRN Kerry Hough, PA        Lab Results: No results found for this or any previous visit (from the past 48 hour(s)).  Physical Findings: AIMS: Facial and Oral Movements Muscles of Facial Expression: None, normal Lips and Perioral Area: None, normal Jaw: None, normal Tongue: None, normal,Extremity Movements Upper (arms, wrists, hands, fingers): None, normal Lower (legs, knees, ankles, toes): None,  normal, Trunk Movements Neck, shoulders, hips: None, normal,  Overall Severity Severity of abnormal movements (highest score from questions above): None, normal Incapacitation due to abnormal movements: None, normal Patient's awareness of abnormal movements (rate only patient's report): No Awareness, Dental Status Current problems with teeth and/or dentures?: No Does patient usually wear dentures?: No  CIWA:  CIWA-Ar Total: 2  COWS:  COWS Total Score: 6   Treatment Plan Summary: Daily contact with patient to assess and evaluate symptoms and progress in treatment Medication management Received cogentin 2 mg IM and Benedryl 50 mg IM this am for muscle rigidity  Plan: Continue to monitor for any further signs and symptoms of muscle rigidity. Continue current treatment plan.  Armandina Stammer I 02/19/2012, 2:47 PM

## 2012-02-19 NOTE — Progress Notes (Signed)
Pt attended discharge planning group and actively participated in group.  SW provided pt with today's workbook.  Pt presents with calm mood and affect.  Pt denies having depression, anxiety and SI/HI.  Pt states that he feels much better today.  Pt states that he was given a sleep medication that knocked him out yesterday.  Pt states that he slept okay last night.  Pt states that he would rather go to [facility] than [facility].  Pt states that he wants to go to treatment so he can get back to work and help his roommate with bills.  Pt is scheduled to go to [facility] on Wednesday but SW will call for [facility] when pt is stable.  Pt had to excuse himself from group, stating his back and neck were doing something weird.  No further needs voiced by pt at this time.    Reyes Ivan, LCSWA 02/19/2012  10:15 AM

## 2012-02-20 MED ORDER — PANTOPRAZOLE SODIUM 40 MG PO TBEC: 40.0000 mg | DELAYED_RELEASE_TABLET | Freq: Two times a day (BID) | ORAL | Status: DC

## 2012-02-20 MED ORDER — ADULT MULTIVITAMIN W/MINERALS CH: 1.0000 | ORAL_TABLET | Freq: Every day | ORAL | Status: DC

## 2012-02-20 NOTE — Tx Team (Signed)
Interdisciplinary Treatment Plan Update (Adult)  Date:  02/20/2012  Time Reviewed:  9:50 AM   Progress in Treatment: Attending groups: Yes Participating in groups:  Yes Taking medication as prescribed: Yes Tolerating medication:  Yes Family/Significant othe contact made: No Patient understands diagnosis:  Yes Discussing patient identified problems/goals with staff:  Yes Medical problems stabilized or resolved:  Yes Denies suicidal/homicidal ideation: Yes Issues/concerns per patient self-inventory:  None identified Other: N/A  New problem(s) identified: None Identified  Reason for Continuation of Hospitalization: Stable to d/c  Interventions implemented related to continuation of hospitalization: Stable to d/c  Additional comments: N/A  Estimated length of stay: D/C today  Discharge Plan: Pt will follow up with [facility] for further treatment.  SW also provided pt with info on [facility] walk in clinic.    New goal(s): N/A  Review of initial/current patient goals per problem list:    1.  Goal(s): Address substance use  Met:  Yes  Target date: by discharge  As evidenced by: completed detox protocol and referred to appropriate treatment  2.  Goal (s): Reduce depressive and anxiety symptoms  Met:  Yes  Target date: by discharge  As evidenced by: Reducing depression from a 10 to a 3 as reported by pt.  Pt rates at a 0 today.   3.  Goal(s): Eliminate SI  Met:  Yes  Target date: by discharge  As evidenced by: Pt denies SI.    Attendees: Patient:   02/20/2012 9:50 AM   Family:     Physician: Franchot Gallo, MD 02/20/2012 9:50 AM   Nursing: Stephannie Li, RN 02/20/2012 9:50 AM   Case Manager:  Reyes Ivan, LCSWA 02/20/2012 9:50 AM   Counselor:  Marni Griffon, LCAS 02/20/2012 9:50 AM   Other:  Verne Spurr, PA  02/20/2012 9:50 AM   Other:     Other:     Other:      Scribe for Treatment Team:   Reyes Ivan 02/20/2012 9:50 AM

## 2012-02-20 NOTE — [facility] Suicide Risk Assessment (Signed)
Suicide Risk Assessment  Discharge Assessment     Demographic factors:  Male;Caucasian;Low socioeconomic status;Unemployed;Access to firearms Unemployed  Current Mental Status Per Nursing Assessment::   On Admission:   (Currently denies) At Discharge:  The patient was seen today and states that he feels ready for discharge to his new Group Home.  The patient reports that he is having some difficulty sleeping at night but reports this is "my normal."  The patient reports mild feelings of sadness, anhedonia and depressed mood but adamantly denies any suicidal or homicidal ideations.  The patient also adamantly denies any auditory or visual hallucinations or delusional thinking.  The patient does report some mild anxiety symptoms.  The patient denies any symptoms of substance withdrawal or medication related side effects.  Current Mental Status Per Physician:  Diagnosis:  Axis I:   Heroin Dependence.    Alcohol Dependence.    Substance Induced Mood Disorder.  Axis II:  Deferred.  Axis III:  1. Gastroesophageal Reflux Disease.    2. Hypertension.    3. Peptic Ulcer Disease.  Axis IV:  Chronic Substance Abuse Issues. Unemployment. Corporate treasurer.  Axis V:  GAF at time of admission approximately 40. Highest GAF in the last year approximately 65.  .  The patient was seen today and reports the following:   ADL's: Intact.  Sleep: The patient reports to having some difficulty sleeping but states this is "normal." Appetite: The patient reports a good appetite today.   Mild>(1-10) >Severe  Hopelessness (1-10): 0  Depression (1-10): 0  Anxiety (1-10): 2-3   Suicidal Ideation: The patient adamantly denies any suicidal ideations today.  Plan: No  Intent: No  Means: No   Homicidal Ideation: The patient adamantly denies any homicidal ideations today.  Plan: No  Intent: No.  Means: No   General Appearance/Behavior: The patient was friendly and cooperative today with this provider.    Eye Contact: Good.  Speech: Appropriate in rate and volume with no pressuring of speech noted today.  Motor Behavior: wnl.  Level of Consciousness: Alert and Oriented x 3.  Mental Status: Alert and Oriented x 3.  Mood: Essentially Euthymic.  Affect: Bright and full.  Anxiety Level: Mild anxiety reported today.  Thought Process: wnl Thought Content: The patient denies any auditory or visual hallucinations or delusional thinking.  The patient denies any delusional thinking. Perception: wnl  Judgment: Fair to Good.  Insight: Fair to Good.  Cognition: Oriented to person, place and time.   Loss Factors: Decrease in vocational status;Financial problems / change in socioeconomic status;Legal issues  Historical Factors: Prior suicide attempts;Family history of suicide;Family history of mental illness or substance abuse  Risk Reduction Factors:   Positive social support;Positive therapeutic relationship Good primary support.  Continued Clinical Symptoms:  Alcohol/Substance Abuse/Dependencies  Discharge Diagnoses:   Axis I:    Heroin Dependence.     Alcohol Dependence.     Substance Induced Mood Disorder.  Axis II:   Deferred.  Axis III:   1. Gastroesophageal Reflux Disease.     2. Hypertension.     3. Peptic Ulcer Disease.  Axis IV:   Chronic Substance Abuse Issues. Unemployment. Corporate treasurer.  Axis V:   GAF at time of admission approximately 40. Highest GAF in the last year approximately 65.   Cognitive Features That Contribute To Risk:  None Noted.    Current Medications:    . chlordiazePOXIDE  25 mg Oral BH-qamhs   Followed by  . chlordiazePOXIDE  25 mg  Oral Daily  . cloNIDine  0.1 mg Oral BH-qamhs   Followed by  . cloNIDine  0.1 mg Oral QAC breakfast  . feeding supplement  237 mL Oral BID PC  . multivitamin with minerals  1 tablet Oral Daily  . nicotine  21 mg Transdermal Q0600  . pantoprazole  40 mg Oral BID AC  . thiamine  100 mg Oral Daily  . traZODone   75 mg Oral QHS   Review of Systems:  Neurological: No headaches, seizures or dizziness reported.  G.I.: The patient denies any constipation or stomach upset today.  Musculoskeletal: The patient denies any musculoskeletal issues today.   The patient was seen today and states that he feels ready for discharge to his new Group Home.  The patient reports that he is having some difficulty sleeping at night but reports this is "my normal."  The patient reports mild feelings of sadness, anhedonia and depressed mood but adamantly denies any suicidal or homicidal ideations.  The patient also adamantly denies any auditory or visual hallucinations or delusional thinking.  The patient does report some mild anxiety symptoms.  The patient denies any symptoms of substance withdrawal or medication related side effects.  Treatment Plan Summary:  1. Daily contact with patient to assess and evaluate symptoms and progress in treatment.  2. Medication management  3. The patient will deny suicidal ideations or homicidal ideations for 48 hours prior to discharge and have a depression and anxiety rating of 3 or less. The patient will also deny any auditory or visual hallucinations or delusional thinking.  4. The patient will deny any symptoms of substance withdrawal at time of discharge.   Plan:  1. Will continue the patient on his current medications as listed above. 2. Laboratory Studies reviewed.  3. Will continue to monitor.  4. The patient will be discharged today at his request to outpatient follow up.  Suicide Risk:  Minimal: No identifiable suicidal ideation.  Patients presenting with no risk factors but with morbid ruminations; may be classified as minimal risk based on the severity of the depressive symptoms  Plan Of Care/Follow-up recommendations:  Activity:  As tolerated. Diet:  Regular. Other:  Please take all medications only as directed and keep all scheduled follow up appointments.  Please  abstain from any use of alcohol or illicit drugs.  Na Waldrip 02/20/2012, 12:19 PM

## 2012-02-20 NOTE — Progress Notes (Signed)
[facility] Case Management Discharge Plan:  Will you be returning to the same living situation after discharge: Yes,  returning home At discharge, do you have transportation home?:Yes,  access to transportation Do you have the ability to pay for your medications:Yes,  access to meds   Release of information consent forms completed and in the chart;  Patient's signature needed at discharge.  Patient to Follow up at:  Follow-up Information    Follow up with [facility] on 02/25/2012. (You are scheduled for an admission assessment at 8 AM)    Contact information:   [address] [place], [place]  [address]  [phone]         Patient denies SI/HI:   Yes,  denies SI/Hi    Safety Planning and Suicide Prevention discussed:  Yes,  discussed with pt today  Barrier to discharge identified:No.  Summary and Recommendations: Pt attended discharge planning group and actively participated in group.  SW provided pt with today's workbook.  Pt presents with calm mood and affect.  Pt rates depression at a 0 and anxiety at a 6-7 today.  Pt denies SI/HI.  Pt reports feeling stable to d/c today.  Pt explained that his girlfriend was in a car accident and he has to get home to help with the kids.  Pt also states that he had a court date today; SW faxed a letter to the clerk of court and DA stating pt is in the hospital.  Pt has decided to follow up with [facility] on Wednesday for further treatment.  Pt also asked for information on [facility] incase he doesn't get into [facility].  No recommendations from SW.  No further needs voiced by pt.  Pt stable to discharge.      Carmina Miller 02/20/2012, 9:54 AM

## 2012-02-20 NOTE — Progress Notes (Signed)
See d/c note

## 2012-02-20 NOTE — Progress Notes (Signed)
Patient ID: Joseph Duarte, male   DOB: August 23, 1980, 31 y.o.   MRN: [id number] Nursing d/c note: Patient appropriate during d/c process. Reviewed all medication instruction and f/u medical appointments.  Patient verbalized understanding of all instruction. Emphasized importance of attending day of d/c AA meeting and sponsorship. Med samples provided with RX's. Denies SI and states all treatment goals were met. Escorted to lobby to wait for aquaintance transportation.

## 2012-02-20 NOTE — [facility] Suicide Risk Assessment (Deleted)
Suicide Risk Assessment  Discharge Assessment     Demographic factors:  Male;Caucasian;Low socioeconomic status;Unemployed;Access to firearms  Current Mental Status Per Nursing Assessment::   On Admission:   (Currently denies) At Discharge: Time was spent today discussing with the patient his current symptoms. The patient reports to sleeping well and reports a good appetite. He denies any significant feelings of sadness, anhedonia or depressed mood.  He adamantly denies any suicidal or homicidal ideations and denies any auditory or visual hallucinations or delusional thinking. The patient also denies any anxiety symptoms.   The patient denies any symptoms of substance withdrawal and feel ready for discharge.   Current Mental Status Per Physician:  Diagnosis:  Axis I: Alcohol Dependence.   The patient was seen today and reports the following:   ADL's: Intact.  Sleep: The patient reports to sleeping well last night without difficulty.  Appetite: The patient reports a good appetite today.   Mild>(1-10) >Severe  Hopelessness (1-10): 0  Depression (1-10): 0  Anxiety (1-10): 0   Suicidal Ideation: The patient adamantly denies any suicidal ideations today.  Plan: No  Intent: No  Means: No   Homicidal Ideation: The patient adamantly denies any homicidal ideations today.  Plan: No  Intent: No.  Means: No   General Appearance/Behavior: The patient was friendly and cooperative today with this provider.  Eye Contact: Good.  Speech: Appropriate in rate and volume with no pressuring of speech noted today.  Motor Behavior: wnl.  Level of Consciousness: Alert and Oriented x 3.  Mental Status: Alert and Oriented x 3.  Mood: Appears euthymic today. Affect: Full.  Anxiety Level: No anxiety observed today.  Thought Process: wnl.  Thought Content: The patient denies any auditory or visual hallucinations or delusional thinking.  Perception: wnl.  Judgment: Fair to Good.  Insight: Fair to  Good.  Cognition: Oriented to person, place and time.   Loss Factors: Decrease in vocational status;Financial problems / change in socioeconomic status;Legal issues  Historical Factors: Prior suicide attempts;Family history of suicide;Family history of mental illness or substance abuse  Risk Reduction Factors:   Good family support.  Good access to healthcare.  Continued Clinical Symptoms:  Alcohol/Substance Abuse/Dependencies  Discharge Diagnoses:   AXIS I:   Alcohol Dependence. AXIS II:   Deferred. AXIS III:   1. History of Shingles. AXIS IV:   History of substance abuse. AXIS V:   GAF at time of admission approximately 45.  GAF at time of discharge approximately 60.  Cognitive Features That Contribute To Risk:  None Noted.   Suicide Risk:  Minimal: No identifiable suicidal ideation.  Patients presenting with no risk factors but with morbid ruminations; may be classified as minimal risk based on the severity of the depressive symptoms  Plan Of Care/Follow-up recommendations:  Activity:  As tolerated. Diet:  Regular. Other:  Please take all medications only as directed and keep all scheduled follow up appointments.  Please abstain from any use of alcohol or illicit drugs.  Samay Delcarlo 02/20/2012, 10:09 AM

## 2012-02-24 NOTE — Progress Notes (Signed)
Patient Discharge Instructions:  After Visit Summary (AVS):   Faxed to:  02/24/2012 Psychiatric Admission Assessment Note:   Faxed to:  02/24/2012 Suicide Risk Assessment - Discharge Assessment:   Faxed to:  02/24/2012 Faxed/Sent to the Next Level Care provider:  02/24/2012  Faxed to [facility] @ [phone]  Wandra Scot, 02/24/2012, 5:09 PM

## 2012-03-14 NOTE — Discharge Summary (Signed)
Physician Discharge Summary Note  Patient:  Joseph Duarte is an 31 y.o., male MRN:  [id number] DOB:  1980-09-26 Patient phone:  [phone] (home)  Patient address:   [address] [place] [address]   Date of Admission:  02/17/2012 Date of Discharge: 02/20/2012  Discharge Diagnoses: Principal Problem:  *Opioid dependence Active Problems:  Alcohol dependence  Substance induced mood disorder  Axis Diagnosis:  Axis I: Heroin Dependence.  Alcohol Dependence.  Substance Induced Mood Disorder.  Axis II: Deferred.  Axis III: 1. Gastroesophageal Reflux Disease.  2. Hypertension.  3. Peptic Ulcer Disease.  Axis IV: Chronic Substance Abuse Issues. Unemployment. Corporate treasurer.  Axis V: GAF at time of admission approximately 40. Highest GAF in the last year approximately 65.   Level of Care:   Inpatient hospitalization.   Reason for Admission: Time was spent today discussing with the patient his current symptoms. The patient states that he is having significant difficulty initiating and maintaining sleep and reports a poor appetite. The patient reports moderate feelings of sadness, anhedonia and depressed mood and denies any current suicidal or homicidal ideations. He also denies any auditory or visual hallucinations or delusional thinking and reports severe anxiety symptoms.   Mr. Vernier states that he is being admitted for detox from alcohol and heroin. He reports to drinking a 5th of alcohol per day and using at least a gram of heroin per day just prior to admission.   Hospital Course:   The patient attended treatment team meeting this am and met with treatment team members. The patient's symptoms, treatment plan and response to treatment was discussed. The patient endorsed that their symptoms have improved. The patient also stated that they felt stable for discharge.  They reported that from this hospital stay they had learned many coping skills.  In other to  maintain their psychiatric stability, they will continue psychiatric care on an outpatient basis. They will follow-up as outlined below.  In addition they were instructed  to take all your medications as prescribed by their mental healthcare provider and to report any adverse effects and or reactions from your medicines to their outpatient provider promptly.  The patient is also instructed and cautioned to not engage in alcohol and or illegal drug use while on prescription medicines.  In the event of worsening symptoms the patient is instructed to call the crisis hotline, 911 and or go to the nearest ED for appropriate evaluation and treatment of symptoms.   Also while a patient in this hospital, the patient received medication management for his psychiatric symptoms. They were ordered and received as outlined below:    Medication List     As of 03/14/2012  7:23 PM    STOP taking these medications         omeprazole 20 MG tablet   Commonly known as: PRILOSEC OTC      TAKE these medications      Indication    multivitamin with minerals Tabs   Take 1 tablet by mouth daily. For nutritional supplementation.       pantoprazole 40 MG tablet   Commonly known as: PROTONIX   Take 1 tablet (40 mg total) by mouth 2 (two) times daily before a meal. For GERD.        They were also enrolled in group counseling sessions and activities in which they participated actively.       Follow-up Information    Follow up with [facility] on 02/25/2012. (  You are scheduled for an admission assessment at 8 AM)    Contact information:   [address] [place], [place]  [address]  [phone]        Upon discharge, patient adamantly denies suicidal, homicidal ideations, auditory, visual hallucinations and or delusional thinking. They left [facility] with all personal belongings via personal transportation in no apparent distress.  Consults: Please see electronic medical record for details.    Significant Diagnostic Studies:  Please see electronic medical record for details.   Discharge Vitals:   Blood pressure 107/74, pulse 106, temperature 97.2 F (36.2 C), temperature source Oral, resp. rate 24, height 6\' 4"  (1.93 m), weight 68.04 kg (150 lb)..  Mental Status Exam: Demographic factors:  Male;Caucasian;Low socioeconomic status;Unemployed;Access to firearms  Unemployed   Current Mental Status Per Nursing Assessment::  On Admission: (Currently denies)  At Discharge: The patient was seen today and states that he feels ready for discharge to his new Group Home. The patient reports that he is having some difficulty sleeping at night but reports this is "my normal." The patient reports mild feelings of sadness, anhedonia and depressed mood but adamantly denies any suicidal or homicidal ideations. The patient also adamantly denies any auditory or visual hallucinations or delusional thinking. The patient does report some mild anxiety symptoms. The patient denies any symptoms of substance withdrawal or medication related side effects.   Current Mental Status Per Physician:  Diagnosis:  Axis I: Heroin Dependence.  Alcohol Dependence.  Substance Induced Mood Disorder.  Axis II: Deferred.  Axis III: 1. Gastroesophageal Reflux Disease.  2. Hypertension.  3. Peptic Ulcer Disease.  Axis IV: Chronic Substance Abuse Issues. Unemployment. Corporate treasurer.  Axis V: GAF at time of admission approximately 40. Highest GAF in the last year approximately 65.  .  The patient was seen today and reports the following:   ADL's: Intact.  Sleep: The patient reports to having some difficulty sleeping but states this is "normal."  Appetite: The patient reports a good appetite today.   Mild>(1-10) >Severe  Hopelessness (1-10): 0  Depression (1-10): 0  Anxiety (1-10): 2-3   Suicidal Ideation: The patient adamantly denies any suicidal ideations today.  Plan: No  Intent: No  Means: No    Homicidal Ideation: The patient adamantly denies any homicidal ideations today.  Plan: No  Intent: No.  Means: No   General Appearance/Behavior: The patient was friendly and cooperative today with this provider.  Eye Contact: Good.  Speech: Appropriate in rate and volume with no pressuring of speech noted today.  Motor Behavior: wnl.  Level of Consciousness: Alert and Oriented x 3.  Mental Status: Alert and Oriented x 3.  Mood: Essentially Euthymic.  Affect: Bright and full.  Anxiety Level: Mild anxiety reported today.  Thought Process: wnl  Thought Content: The patient denies any auditory or visual hallucinations or delusional thinking. The patient denies any delusional thinking.  Perception: wnl  Judgment: Fair to Good.  Insight: Fair to Good.  Cognition: Oriented to person, place and time.   Loss Factors:  Decrease in vocational status;Financial problems / change in socioeconomic status;Legal issues   Historical Factors:  Prior suicide attempts;Family history of suicide;Family history of mental illness or substance abuse   Risk Reduction Factors:  Positive social support;Positive therapeutic relationship Good primary support.   Continued Clinical Symptoms:  Alcohol/Substance Abuse/Dependencies   Discharge Diagnoses:  Axis I: Heroin Dependence.  Alcohol Dependence.  Substance Induced Mood Disorder.  Axis II: Deferred.  Axis III:  1. Gastroesophageal Reflux Disease.  2. Hypertension.  3. Peptic Ulcer Disease.  Axis IV: Chronic Substance Abuse Issues. Unemployment. Corporate treasurer.  Axis V: GAF at time of admission approximately 40. Highest GAF in the last year approximately 65.   Cognitive Features That Contribute To Risk:  None Noted.  Current Medications:   .  chlordiazePOXIDE  25 mg  Oral  BH-qamhs    Followed by   .  chlordiazePOXIDE  25 mg  Oral  Daily   .  cloNIDine  0.1 mg  Oral  BH-qamhs    Followed by   .  cloNIDine  0.1 mg  Oral  QAC breakfast    .  feeding supplement  237 mL  Oral  BID PC   .  multivitamin with minerals  1 tablet  Oral  Daily   .  nicotine  21 mg  Transdermal  Q0600   .  pantoprazole  40 mg  Oral  BID AC   .  thiamine  100 mg  Oral  Daily   .  traZODone  75 mg  Oral  QHS    Review of Systems:  Neurological: No headaches, seizures or dizziness reported.  G.I.: The patient denies any constipation or stomach upset today.  Musculoskeletal: The patient denies any musculoskeletal issues today.   The patient was seen today and states that he feels ready for discharge to his new Group Home. The patient reports that he is having some difficulty sleeping at night but reports this is "my normal." The patient reports mild feelings of sadness, anhedonia and depressed mood but adamantly denies any suicidal or homicidal ideations. The patient also adamantly denies any auditory or visual hallucinations or delusional thinking. The patient does report some mild anxiety symptoms. The patient denies any symptoms of substance withdrawal or medication related side effects.   Treatment Plan Summary:  1. Daily contact with patient to assess and evaluate symptoms and progress in treatment.  2. Medication management  3. The patient will deny suicidal ideations or homicidal ideations for 48 hours prior to discharge and have a depression and anxiety rating of 3 or less. The patient will also deny any auditory or visual hallucinations or delusional thinking.  4. The patient will deny any symptoms of substance withdrawal at time of discharge.   Plan:  1. Will continue the patient on his current medications as listed above.  2. Laboratory Studies reviewed.  3. Will continue to monitor.  4. The patient will be discharged today at his request to outpatient follow up.   Suicide Risk:  Minimal: No identifiable suicidal ideation. Patients presenting with no risk factors but with morbid ruminations; may be classified as minimal risk based on the  severity of the depressive symptoms   Plan Of Care/Follow-up recommendations:  Activity: As tolerated.  Diet: Regular.  Other: Please take all medications only as directed and keep all scheduled follow up appointments. Please abstain from any use of alcohol or illicit drugs.  Discharge destination:  ALF  Is patient on multiple antipsychotic therapies at discharge:  No  Has Patient had three or more failed trials of antipsychotic monotherapy by history: N/A Recommended Plan for Multiple Antipsychotic Therapies: N/A  Discharge Orders    Future Orders Please Complete By Expires   Diet - low sodium heart healthy      Increase activity slowly      Discharge instructions      Comments:   Please take all medications only as directed  and keep all scheduled follow up appointments.  Please abstain from any use of alcohol or illicit drugs.       Medication List     As of 03/14/2012  7:23 PM    STOP taking these medications         omeprazole 20 MG tablet   Commonly known as: PRILOSEC OTC      TAKE these medications      Indication    multivitamin with minerals Tabs   Take 1 tablet by mouth daily. For nutritional supplementation.       pantoprazole 40 MG tablet   Commonly known as: PROTONIX   Take 1 tablet (40 mg total) by mouth 2 (two) times daily before a meal. For GERD.            Follow-up Information    Follow up with [facility] on 02/25/2012. (You are scheduled for an admission assessment at 8 AM)    Contact information:   [address] [place], [place]  [address]  [phone]        Follow-up recommendations:   Activities: Resume typical activities Diet: Resume typical diet Other: Follow up with outpatient provider and report any side effects to out patient prescriber.  Comments:  Take all your medications as prescribed by your mental healthcare provider. Report any adverse effects and or reactions from your medicines to your outpatient provider  promptly. Patient is instructed and cautioned to not engage in alcohol and or illegal drug use while on prescription medicines. In the event of worsening symptoms, patient is instructed to call the crisis hotline, 911 and or go to the nearest ED for appropriate evaluation and treatment of symptoms. Follow-up with your primary care provider for your other medical issues, concerns and or health care needs.  Signed: Franchot Gallo 03/14/2012 7:23 PM

## 2012-08-12 ENCOUNTER — Emergency Department (HOSPITAL_COMMUNITY)
Admission: EM | Admit: 2012-08-12 | Discharge: 2012-08-12 | Disposition: A | Discharge status: Home / Self Care | Payer: Self-pay | Attending: Emergency Medicine | Admitting: Emergency Medicine

## 2012-08-12 DIAGNOSIS — Y929 Unspecified place or not applicable: Secondary | ICD-10-CM | POA: Insufficient documentation

## 2012-08-12 DIAGNOSIS — Z8619 Personal history of other infectious and parasitic diseases: Secondary | ICD-10-CM | POA: Insufficient documentation

## 2012-08-12 DIAGNOSIS — W268XXA Contact with other sharp object(s), not elsewhere classified, initial encounter: Secondary | ICD-10-CM | POA: Insufficient documentation

## 2012-08-12 DIAGNOSIS — Z23 Encounter for immunization: Secondary | ICD-10-CM | POA: Insufficient documentation

## 2012-08-12 DIAGNOSIS — F17200 Nicotine dependence, unspecified, uncomplicated: Secondary | ICD-10-CM | POA: Insufficient documentation

## 2012-08-12 DIAGNOSIS — Y939 Activity, unspecified: Secondary | ICD-10-CM | POA: Insufficient documentation

## 2012-08-12 DIAGNOSIS — S61409A Unspecified open wound of unspecified hand, initial encounter: Secondary | ICD-10-CM | POA: Insufficient documentation

## 2012-08-12 DIAGNOSIS — Z8709 Personal history of other diseases of the respiratory system: Secondary | ICD-10-CM | POA: Insufficient documentation

## 2012-08-12 MED ORDER — TETANUS-DIPHTH-ACELL PERTUSSIS 5-2.5-18.5 LF-MCG/0.5 IM SUSP
0.5000 mL | Freq: Once | INTRAMUSCULAR | Status: AC
  Administered 2012-08-12: 0.5 mL via INTRAMUSCULAR
  Filled 2012-08-12: qty 0.5

## 2012-08-12 NOTE — ED Provider Notes (Signed)
History     CSN: [id number]  Arrival date & time 08/12/12  1518   First MD Initiated Contact with Patient 08/12/12 1552      Chief Complaint  Patient presents with  . Extremity Laceration    (Consider location/radiation/quality/duration/timing/severity/associated sxs/prior treatment) The history is provided by the patient and medical records.    Joseph Duarte is a 32 y.o. male  with a hx of shingles  presents to the Emergency Department complaining of acute persistent, stable laceration to the lateral portion of the L hand onset 2 hours ago. Associated symptoms include bleeding, pain.  Nothing makes it better and nothing makes it worse.  Pt denies fever, chills, headache, chest, pain, abdominal pain, nausea, vomiting, diarrhea, weakness, numbness, tingling, syncope. Unknown last tetanus shot.     Past Medical History  Diagnosis Date  . Pneumothorax   . Shingles     Past Surgical History  Procedure Laterality Date  . Fracture surgery      right hand fracture repaired with plate 3086  . Hernia repair      as a child    No family history on file.  History  Substance Use Topics  . Smoking status: Current Every Day Smoker -- 1.00 packs/day for 17 years    Types: Cigarettes  . Smokeless tobacco: Former Neurosurgeon    Quit date: 02/17/2012  . Alcohol Use: Yes     Comment: 4-5 fifths/wk      Review of Systems  Constitutional: Negative for fever.  Skin: Positive for wound.  Neurological: Negative for weakness and numbness.  All other systems reviewed and are negative.    Allergies  Bee venom and Haldol  Home Medications   Current Outpatient Rx  Name  Route  Sig  Dispense  Refill  . Multiple Vitamin (MULTIVITAMIN WITH MINERALS) TABS   Oral   Take 1 tablet by mouth daily. For nutritional supplementation.   30 tablet   0     There were no vitals taken for this visit.  Physical Exam  Nursing note and vitals reviewed. Constitutional: He is oriented to person,  place, and time. He appears well-developed and well-nourished. No distress.  HENT:  Head: Normocephalic and atraumatic.  Eyes: Conjunctivae are normal. No scleral icterus.  Neck: Normal range of motion.  Cardiovascular: Normal rate, regular rhythm and intact distal pulses.   Pulmonary/Chest: Effort normal and breath sounds normal. No respiratory distress.  Musculoskeletal: Normal range of motion. He exhibits no edema.       Left hand: He exhibits normal range of motion, no tenderness, no bony tenderness, normal two-point discrimination, normal capillary refill, no deformity, no laceration and no swelling. Normal sensation noted. Normal strength noted.       Hands: Neurological: He is alert and oriented to person, place, and time.  Skin: Skin is warm and dry. He is not diaphoretic.  4cm superficial laceration to the L hand    ED Course  LACERATION REPAIR Date/Time: 08/12/2012 4:02 PM Performed by: Dierdre Forth Authorized by: Dierdre Forth Consent: Verbal consent obtained. Risks and benefits: risks, benefits and alternatives were discussed Consent given by: patient Patient understanding: patient states understanding of the procedure being performed Patient consent: the patient's understanding of the procedure matches consent given Procedure consent: procedure consent matches procedure scheduled Relevant documents: relevant documents present and verified Site marked: the operative site was marked Required items: required blood products, implants, devices, and special equipment available Patient identity confirmed: verbally with patient Time out: Immediately  prior to procedure a "time out" was called to verify the correct patient, procedure, equipment, support staff and site/side marked as required. Body area: upper extremity Location details: left hand Laceration length: 4 cm Foreign bodies: no foreign bodies Tendon involvement: none Nerve involvement: none Vascular  damage: no Patient sedated: no Irrigation solution: saline and tap water Irrigation method: syringe and tap Amount of cleaning: standard Debridement: none Degree of undermining: none Skin closure: glue Approximation: close Approximation difficulty: simple Dressing: 4x4 sterile gauze Patient tolerance: Patient tolerated the procedure well with no immediate complications.   (including critical care time)  Labs Reviewed - No data to display No results found.   1. Laceration of hand       MDM  Tymeir Weathington presents with superficial laceration not deep enough to stitch.  Tdap booster given.Pressure irrigation performed. Laceration occurred < 8 hours prior to repair which was well tolerated. Pt has no co morbidities to effect normal wound healing. Discussed suture home care w pt and answered questions. Pt to f-u for wound check in 7 days. Pt is hemodynamically stable w no complaints prior to dc.    1. Medications: tylenol or motrin for pain control, usual home medications 2. Treatment: rest, drink plenty of fluids, keep wound clean and dry, do not submerge hand in water for 7 days 3. Follow Up: Please followup with your primary doctor for discussion of your diagnoses and further evaluation after today's visit; if you do not have a primary care doctor use the resource guide provided to find one;           Dierdre Forth, PA-C 08/12/12 1610

## 2012-08-12 NOTE — ED Notes (Addendum)
Cut left hand on sheet metal roof one hour ago, bleeding controlled, small laceration. Requesting tetanus shot

## 2012-08-12 NOTE — ED Notes (Signed)
Dermabond applied by PA. Pt tolerated well. Bleeding controlled.

## 2012-08-13 NOTE — ED Provider Notes (Signed)
Medical screening examination/treatment/procedure(s) were performed by non-physician practitioner and as supervising physician I was immediately available for consultation/collaboration.  Carrolyn Hilmes, MD 08/13/12 0108 

## 2012-12-05 IMAGING — CR DG ABDOMEN ACUTE W/ 1V CHEST
4 series · 4 of 4 positions shown · non-contrast
Comparison: Chest radiograph 12/21/2010.

CLINICAL DATA: Medical clearance, nausea, vomiting, chest pain and
shortness of breath.

ACUTE ABDOMEN SERIES (ABDOMEN 2 VIEW & CHEST 1 VIEW)

[w chest pa]
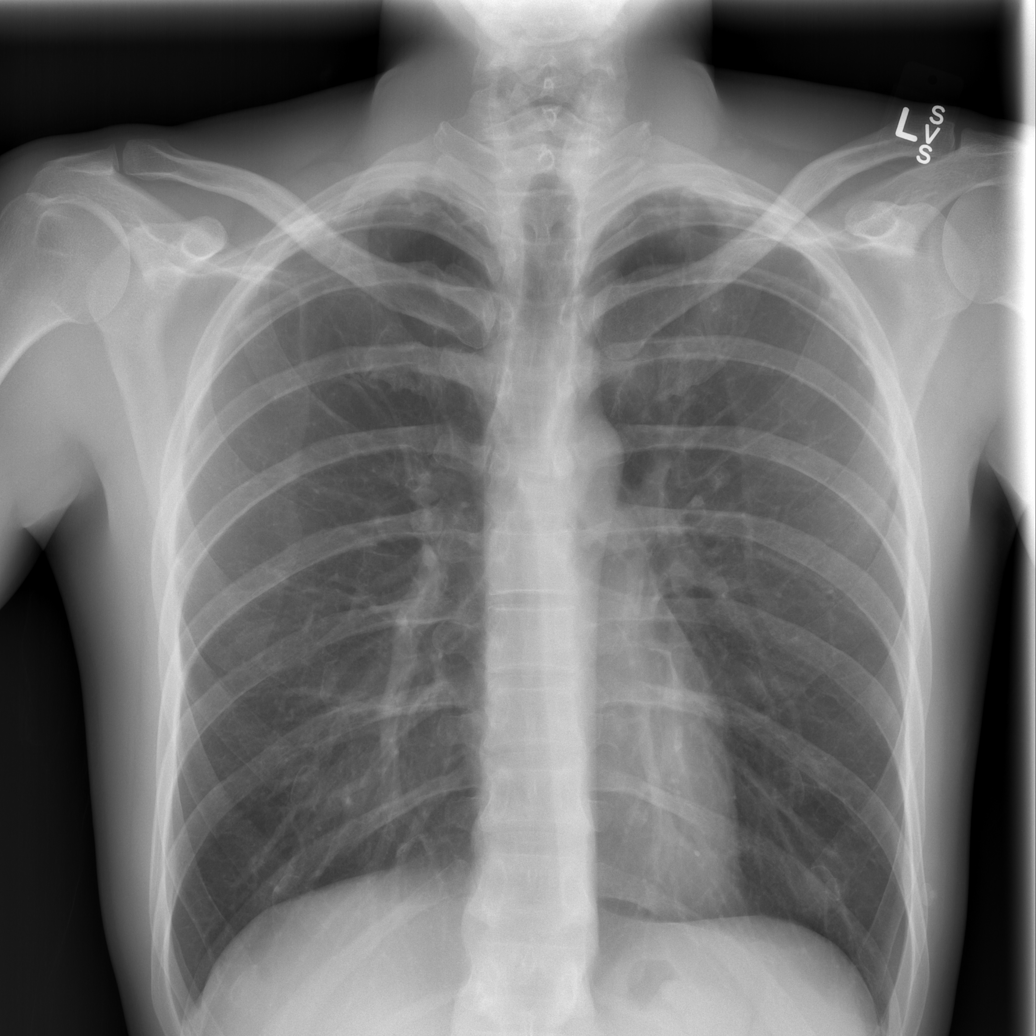

[w abdomen upright *]
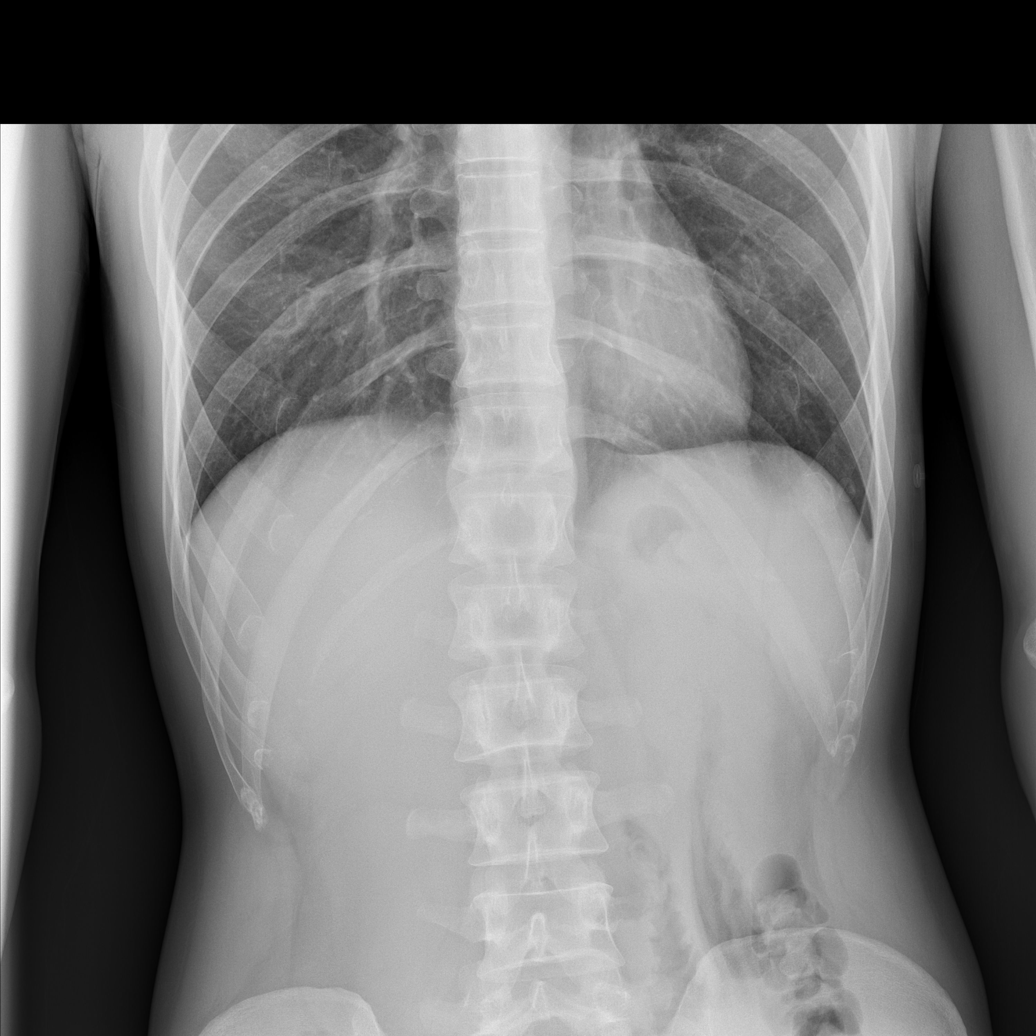

[t abdomen supine (1 of 2)]
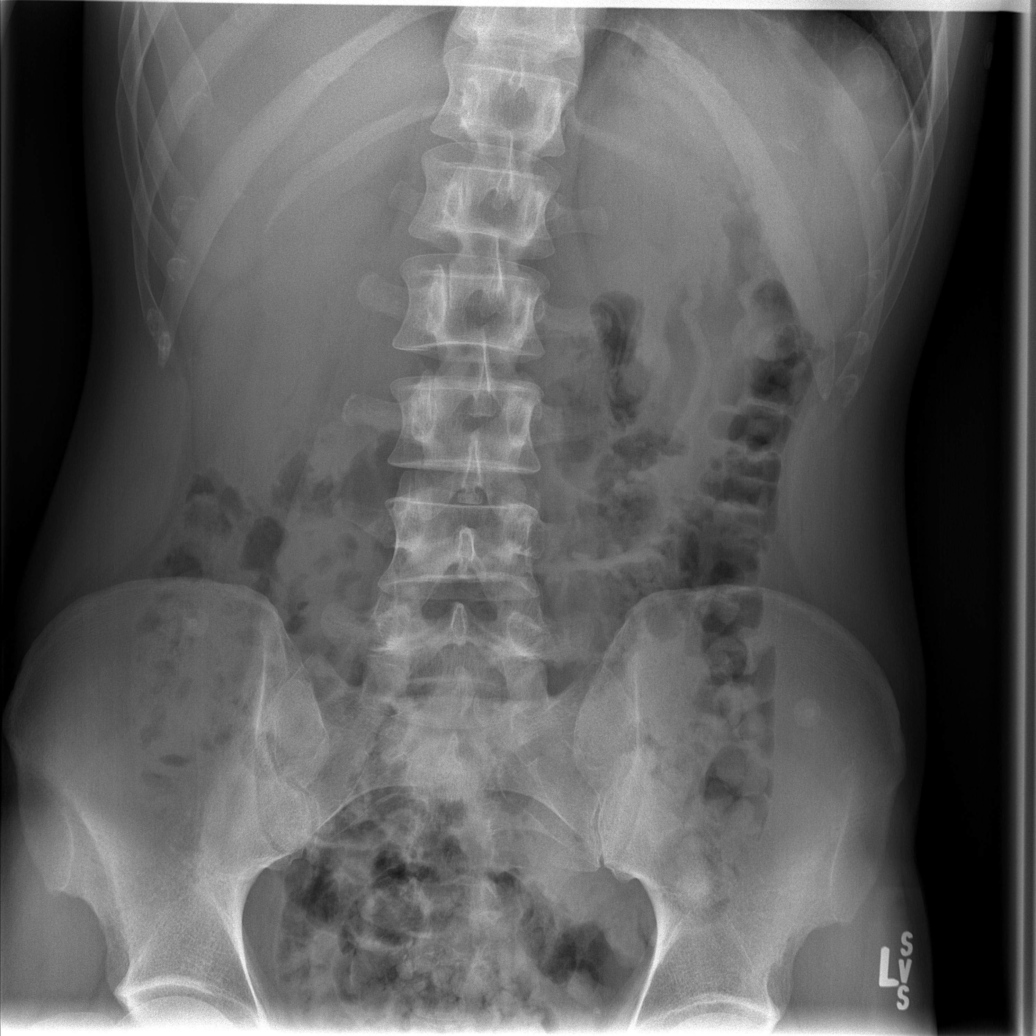

[t abdomen supine (2 of 2)]
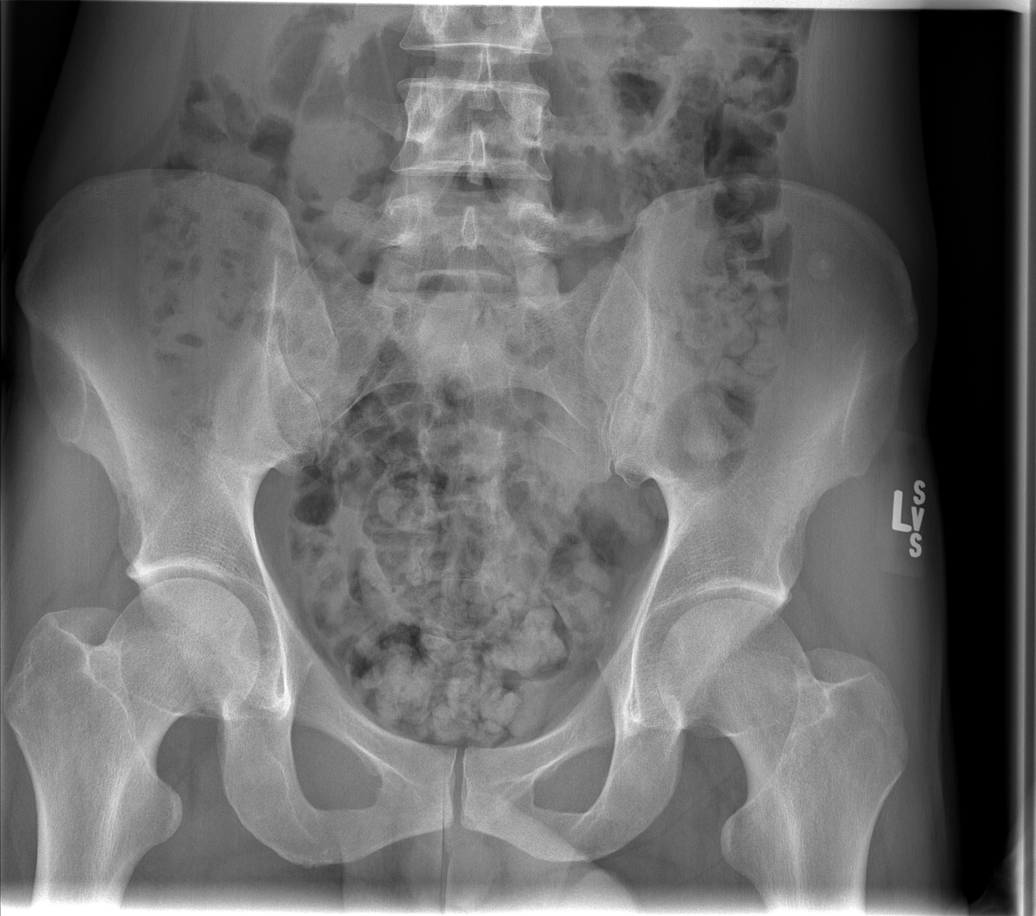

[4 of 4 positions shown; findings below may reference images not displayed]

FINDINGS: Frontal view of the chest shows midline trachea and
normal heart size.  Biapical pleural parenchymal scarring.  Lungs
are otherwise clear.  No pleural fluid.

Two views of the abdomen show gas and stool in the colon.  No small
bowel dilatation.
IMPRESSION: No acute findings.

## 2013-02-10 ENCOUNTER — Emergency Department (HOSPITAL_COMMUNITY)
Admission: EM | Admit: 2013-02-10 | Discharge: 2013-02-10 | Disposition: A | Discharge status: Home / Self Care | Payer: Self-pay | Attending: Emergency Medicine | Admitting: Emergency Medicine

## 2013-02-10 ENCOUNTER — Encounter (HOSPITAL_COMMUNITY): Payer: Self-pay

## 2013-02-10 DIAGNOSIS — Z8619 Personal history of other infectious and parasitic diseases: Secondary | ICD-10-CM | POA: Insufficient documentation

## 2013-02-10 DIAGNOSIS — F17200 Nicotine dependence, unspecified, uncomplicated: Secondary | ICD-10-CM | POA: Insufficient documentation

## 2013-02-10 DIAGNOSIS — Z8709 Personal history of other diseases of the respiratory system: Secondary | ICD-10-CM | POA: Insufficient documentation

## 2013-02-10 DIAGNOSIS — K047 Periapical abscess without sinus: Secondary | ICD-10-CM | POA: Insufficient documentation

## 2013-02-10 MED ORDER — OXYCODONE-ACETAMINOPHEN 5-325 MG PO TABS: 2.0000 | ORAL_TABLET | Freq: Four times a day (QID) | ORAL | Status: DC | PRN

## 2013-02-10 MED ORDER — PENICILLIN V POTASSIUM 500 MG PO TABS: 500.0000 mg | ORAL_TABLET | Freq: Four times a day (QID) | ORAL | Status: DC

## 2013-02-10 NOTE — ED Provider Notes (Signed)
CSN: [id number]     Arrival date & time 02/10/13  2022 History     First MD Initiated Contact with Patient 02/10/13 2111     Chief Complaint  Patient presents with  . Facial Swelling   (Consider location/radiation/quality/duration/timing/severity/associated sxs/prior Treatment) HPI Comments: Patient is a 32 year old male who presents today with right sided facial swelling and pain. He reports it began suddenly when he woke up from his nap today a few hours ago. He is having sharp pain that radiatesinto his right ear. He reports that he has been having problems with his right lower tooth for quite some time now. The pain wasn't resolving. He had mild dental pain prior to his nap. He took some Advil and gargle with saltwater. When he awoke his pain was increased. He denies fever, chills, nausea, vomiting, abdominal pain, trismus, drooling. He is not diabetic.   The history is provided by the patient. No language interpreter was used.    Past Medical History  Diagnosis Date  . Pneumothorax   . Shingles    Past Surgical History  Procedure Laterality Date  . Fracture surgery      right hand fracture repaired with plate 8295  . Hernia repair      as a child   History reviewed. No pertinent family history. History  Substance Use Topics  . Smoking status: Current Every Day Smoker -- 1.00 packs/day for 17 years    Types: Cigarettes  . Smokeless tobacco: Former Neurosurgeon    Quit date: 02/17/2012  . Alcohol Use: Yes     Comment: 4-5 fifths/wk    Review of Systems  Constitutional: Negative for fever and chills.  HENT: Positive for dental problem. Negative for drooling and neck pain.   Gastrointestinal: Negative for nausea, vomiting and abdominal pain.  All other systems reviewed and are negative.    Allergies  Bee venom and Haldol  Home Medications   Current Outpatient Rx  Name  Route  Sig  Dispense  Refill  . ibuprofen (ADVIL,MOTRIN) 200 MG tablet   Oral   Take 600 mg by mouth  every 6 (six) hours as needed for pain (pain).         Marland Kitchen omeprazole (PRILOSEC) 20 MG capsule   Oral   Take 20 mg by mouth daily.          BP 112/74  Pulse 69  Temp(Src) 98.2 F (36.8 C) (Oral)  Resp 18  Ht 6\' 4"  (1.93 m)  Wt 185 lb (83.915 kg)  BMI 22.53 kg/m2  SpO2 99% Physical Exam  Nursing note and vitals reviewed. Constitutional: He is oriented to person, place, and time. He appears well-developed and well-nourished. No distress.  HENT:  Head: Normocephalic and atraumatic.  Right Ear: External ear normal.  Left Ear: External ear normal.  Nose: Nose normal.  Mouth/Throat: Uvula is midline, oropharynx is clear and moist and mucous membranes are normal. No trismus in the jaw. Abnormal dentition. Dental caries present.    No trismus, submental edema, or tongue elevation. No drainable abscess.   Eyes: Conjunctivae are normal.  Neck: Normal range of motion. No tracheal deviation present.  Cardiovascular: Normal rate, regular rhythm and normal heart sounds.   Pulmonary/Chest: Effort normal and breath sounds normal. No stridor.  Abdominal: Soft. He exhibits no distension. There is no tenderness.  Musculoskeletal: Normal range of motion.  Neurological: He is alert and oriented to person, place, and time.  Skin: Skin is warm and dry. He  is not diaphoretic.  Psychiatric: He has a normal mood and affect. His behavior is normal.    ED Course   Dental Performed by: Mora Bellman Authorized by: Mora Bellman Consent: Verbal consent obtained. written consent not obtained. The procedure was performed in an emergent situation. Risks and benefits: risks, benefits and alternatives were discussed Consent given by: patient Patient understanding: patient states understanding of the procedure being performed Patient consent: the patient's understanding of the procedure matches consent given Required items: required blood products, implants, devices, and special equipment  available Patient identity confirmed: verbally with patient and arm band Time out: Immediately prior to procedure a "time out" was called to verify the correct patient, procedure, equipment, support staff and site/side marked as required. Preparation: Patient was prepped and draped in the usual sterile fashion. Local anesthesia used: yes Local anesthetic: bupivacaine 0.25% with epinephrine Anesthetic total: 1.8 ml Patient sedated: no Patient tolerance: Patient tolerated the procedure well with no immediate complications.   (including critical care time)  Labs Reviewed - No data to display No results found. 1. Dental abscess     MDM  Patient with toothache.  No gross abscess.  Exam unconcerning for Ludwig's angina or spread of infection.  Will treat with penicillin and pain medicine.  Urged patient to follow-up with dentist.     Mora Bellman, PA-C 02/11/13 [phone]

## 2013-02-10 NOTE — ED Notes (Signed)
Pt complains of right jaw swelling since this afternoon after waking up from a nap

## 2013-02-10 NOTE — ED Notes (Signed)
Pt states that he has a bad tooth on that side.

## 2013-02-17 NOTE — ED Provider Notes (Signed)
Medical screening examination/treatment/procedure(s) were performed by non-physician practitioner and as supervising physician I was immediately available for consultation/collaboration.  Kristen N Ward, DO 02/17/13 1055 

## 2013-05-18 ENCOUNTER — Encounter (HOSPITAL_COMMUNITY): Payer: Self-pay | Admitting: Emergency Medicine

## 2013-05-18 ENCOUNTER — Emergency Department (HOSPITAL_COMMUNITY)
Admission: EM | Admit: 2013-05-18 | Discharge: 2013-05-18 | Disposition: A | Discharge status: Home / Self Care | Payer: Self-pay | Attending: Emergency Medicine | Admitting: Emergency Medicine

## 2013-05-18 ENCOUNTER — Emergency Department (HOSPITAL_COMMUNITY): Payer: Self-pay

## 2013-05-18 DIAGNOSIS — T50901A Poisoning by unspecified drugs, medicaments and biological substances, accidental (unintentional), initial encounter: Secondary | ICD-10-CM

## 2013-05-18 DIAGNOSIS — F17200 Nicotine dependence, unspecified, uncomplicated: Secondary | ICD-10-CM | POA: Insufficient documentation

## 2013-05-18 DIAGNOSIS — F10929 Alcohol use, unspecified with intoxication, unspecified: Secondary | ICD-10-CM

## 2013-05-18 DIAGNOSIS — Z792 Long term (current) use of antibiotics: Secondary | ICD-10-CM | POA: Insufficient documentation

## 2013-05-18 DIAGNOSIS — F1010 Alcohol abuse, uncomplicated: Secondary | ICD-10-CM | POA: Insufficient documentation

## 2013-05-18 DIAGNOSIS — Y929 Unspecified place or not applicable: Secondary | ICD-10-CM | POA: Insufficient documentation

## 2013-05-18 DIAGNOSIS — Z8709 Personal history of other diseases of the respiratory system: Secondary | ICD-10-CM | POA: Insufficient documentation

## 2013-05-18 DIAGNOSIS — T401X4A Poisoning by heroin, undetermined, initial encounter: Secondary | ICD-10-CM | POA: Insufficient documentation

## 2013-05-18 DIAGNOSIS — Z8619 Personal history of other infectious and parasitic diseases: Secondary | ICD-10-CM | POA: Insufficient documentation

## 2013-05-18 DIAGNOSIS — Y9389 Activity, other specified: Secondary | ICD-10-CM | POA: Insufficient documentation

## 2013-05-18 DIAGNOSIS — Z79899 Other long term (current) drug therapy: Secondary | ICD-10-CM | POA: Insufficient documentation

## 2013-05-18 DIAGNOSIS — T401X1A Poisoning by heroin, accidental (unintentional), initial encounter: Secondary | ICD-10-CM | POA: Insufficient documentation

## 2013-05-18 LAB — CBC WITH DIFFERENTIAL/PLATELET
Basophils Absolute: 0 10*3/uL (ref 0.0–0.1)
Eosinophils Relative: 1 % (ref 0–5)
HCT: 42.9 % (ref 39.0–52.0)
Hemoglobin: 15.2 g/dL (ref 13.0–17.0)
Lymphocytes Relative: 53 % — ABNORMAL HIGH (ref 12–46)
Lymphs Abs: 3.8 10*3/uL (ref 0.7–4.0)
MCH: 32.2 pg (ref 26.0–34.0)
MCV: 90.9 fL (ref 78.0–100.0)
Monocytes Absolute: 0.4 10*3/uL (ref 0.1–1.0)
Monocytes Relative: 5 % (ref 3–12)
Neutro Abs: 2.9 10*3/uL (ref 1.7–7.7)
RBC: 4.72 MIL/uL (ref 4.22–5.81)
WBC: 7.2 10*3/uL (ref 4.0–10.5)

## 2013-05-18 LAB — RAPID URINE DRUG SCREEN, HOSP PERFORMED
Amphetamines: NOT DETECTED
Cocaine: NOT DETECTED
Opiates: POSITIVE — AB
Tetrahydrocannabinol: POSITIVE — AB

## 2013-05-18 LAB — POCT I-STAT, CHEM 8
Calcium, Ion: 1.16 mmol/L (ref 1.12–1.23)
Creatinine, Ser: 1.4 mg/dL — ABNORMAL HIGH (ref 0.50–1.35)
Glucose, Bld: 186 mg/dL — ABNORMAL HIGH (ref 70–99)
HCT: 43 % (ref 39.0–52.0)
Hemoglobin: 14.6 g/dL (ref 13.0–17.0)
Potassium: 3.5 mEq/L (ref 3.5–5.1)
Sodium: 145 mEq/L (ref 135–145)

## 2013-05-18 LAB — SALICYLATE LEVEL: Salicylate Lvl: <2 mg/dL — ABNORMAL LOW (ref 2.8–20.0)

## 2013-05-18 MED ORDER — NALOXONE HCL 0.4 MG/ML IJ SOLN
INTRAMUSCULAR | Status: AC
  Filled 2013-05-18: qty 1

## 2013-05-18 MED ORDER — GI COCKTAIL ~~LOC~~
30.0000 mL | Freq: Once | ORAL | Status: AC
  Administered 2013-05-18: 30 mL via ORAL
  Filled 2013-05-18: qty 30

## 2013-05-18 MED ORDER — ONDANSETRON HCL 4 MG/2ML IJ SOLN
4.0000 mg | Freq: Once | INTRAMUSCULAR | Status: AC
  Administered 2013-05-18: 4 mg via INTRAVENOUS
  Filled 2013-05-18: qty 2

## 2013-05-18 MED ORDER — NALOXONE HCL 0.4 MG/ML IJ SOLN
0.4000 mg | Freq: Once | INTRAMUSCULAR | Status: DC
  Filled 2013-05-18: qty 1

## 2013-05-18 MED ORDER — SODIUM CHLORIDE 0.9 % IV BOLUS (SEPSIS)
1000.0000 mL | Freq: Once | INTRAVENOUS | Status: AC
  Administered 2013-05-18: 1000 mL via INTRAVENOUS

## 2013-05-18 MED ORDER — NALOXONE HCL 1 MG/ML IJ SOLN
1.0000 mg | Freq: Once | INTRAMUSCULAR | Status: DC
  Filled 2013-05-18: qty 2

## 2013-05-18 NOTE — ED Notes (Signed)
RN informed pt's fiancee and pt how important is was to closely monitor the pt.

## 2013-05-18 NOTE — ED Notes (Signed)
MD at [facility], MD states narcan is not necessary as the pt is able to walk in a straight line. Pt's fiancee states she will watch the pt closely at home to make sure he is okay. MD informed pt that it is OK for him to go home. MD informed RN to discontinue narcan order.

## 2013-05-18 NOTE — ED Provider Notes (Signed)
CSN: [id number]     Arrival date & time 05/18/13  0401 History   First MD Initiated Contact with Patient 05/18/13 0407     Chief Complaint  Patient presents with  . Drug Overdose   (Consider location/radiation/quality/duration/timing/severity/associated sxs/prior Treatment) Patient is a 32 y.o. male presenting with Overdose. The history is provided by the patient.  Drug Overdose This is a new problem. The current episode started less than 1 hour ago. The problem occurs constantly. The problem has been rapidly improving. Pertinent negatives include no chest pain, no abdominal pain, no headaches and no shortness of breath. Nothing aggravates the symptoms. Nothing relieves the symptoms. Treatments tried: narcan. The treatment provided significant relief.  Not intentional was drinking and relapsed on heroin and was agonal but this was stronger stuff per the patient.  He is adamant he only took this to get high.  Is not trying to hurt himself.   Past Medical History  Diagnosis Date  . Pneumothorax   . Shingles    Past Surgical History  Procedure Laterality Date  . Fracture surgery      right hand fracture repaired with plate 1478  . Hernia repair      as a child   No family history on file. History  Substance Use Topics  . Smoking status: Current Every Day Smoker -- 1.00 packs/day for 17 years    Types: Cigarettes  . Smokeless tobacco: Former Neurosurgeon    Quit date: 02/17/2012  . Alcohol Use: Yes     Comment: 4-5 fifths/wk    Review of Systems  Respiratory: Negative for shortness of breath.   Cardiovascular: Negative for chest pain.  Gastrointestinal: Negative for abdominal pain.  Neurological: Negative for headaches.  All other systems reviewed and are negative.    Allergies  Bee venom and Haldol  Home Medications   Current Outpatient Rx  Name  Route  Sig  Dispense  Refill  . ibuprofen (ADVIL,MOTRIN) 200 MG tablet   Oral   Take 600 mg by mouth every 6 (six) hours as  needed for pain (pain).         Marland Kitchen omeprazole (PRILOSEC) 20 MG capsule   Oral   Take 20 mg by mouth daily.         Marland Kitchen oxyCODONE-acetaminophen (PERCOCET/ROXICET) 5-325 MG per tablet   Oral   Take 2 tablets by mouth every 6 (six) hours as needed for pain.   12 tablet   0   . penicillin v potassium (VEETID) 500 MG tablet   Oral   Take 1 tablet (500 mg total) by mouth 4 (four) times daily.   40 tablet   0    There were no vitals taken for this visit. Physical Exam  Constitutional: He is oriented to person, place, and time. He appears well-developed and well-nourished. No distress.  HENT:  Head: Normocephalic and atraumatic.  Mouth/Throat: Oropharynx is clear and moist.  Eyes: Conjunctivae are normal. Pupils are equal, round, and reactive to light.  Neck: Normal range of motion. Neck supple.  Cardiovascular: Normal rate, regular rhythm and intact distal pulses.   Pulmonary/Chest: Effort normal and breath sounds normal. He has no wheezes. He has no rales.  Abdominal: Soft. Bowel sounds are normal. There is no tenderness. There is no rebound and no guarding.  Musculoskeletal: Normal range of motion.  Neurological: He is alert and oriented to person, place, and time. He has normal reflexes.  Skin: Skin is warm and dry.  Psychiatric: He has  a normal mood and affect.    ED Course  Procedures (including critical care time) Labs Review Labs Reviewed  CBC WITH DIFFERENTIAL  ETHANOL  ACETAMINOPHEN LEVEL  SALICYLATE LEVEL  URINE RAPID DRUG SCREEN (HOSP PERFORMED)   Imaging Review No results found.  EKG Interpretation   None       MDM  No diagnosis found.  Date: 05/18/2013  Rate: 106  Rhythm: sinus tachycardia  QRS Axis: normal  Intervals: normal  ST/T Wave abnormalities: normal  Conduction Disutrbances:none  Narrative Interpretation:   Old EKG Reviewed: none available    Patient awake and alert saturating 100% able to walk and PO challenge.  Will d/c return for  any concerning symptoms  Ritu Gagliardo K Delene Morais-Rasch, MD 05/18/13 [phone]

## 2013-05-18 NOTE — ED Notes (Signed)
Pt's fiancee at bedside.

## 2013-05-18 NOTE — ED Notes (Signed)
MD made aware of pt's pattern of drifting off to sleep and RR rate decreasing and 02 sats decreasing to 85% on RA. MD gave verbal order to adm 1 mg narcan.

## 2013-05-18 NOTE — ED Notes (Signed)
Per EMS pt's friend called EMS, pt was passed out unconscious upon arrival to the department. Pt's friends state the pt used heroin tonight. EMS states pt was breathing no more than 4X/min and that they pt was cyanotic upon initial assessment, EMS administered 1 mg of narcan. EMS states pt was alert within 10 seconds. Pt's RR increased to 16X/min. Pt states he does not remember the event, pt states he smoked marijuana, drank alcohol, and used heroin tonight. PT states he had been sober for six months but attended a funeral today. PT presents to the department today with tremors in both legs. Pt states he has a HA. Pt denies N/V or muscle cramps at this time. Pt is very anxious upon arrival to department.

## 2013-05-18 NOTE — ED Notes (Signed)
Pt having episodes of falling asleep, decreasing his RR, and 02 sats dropping to 89% on RA. MD made aware. Narcan ready to use if need be per MD orders.

## 2013-06-06 ENCOUNTER — Emergency Department (HOSPITAL_COMMUNITY)
Admission: EM | Admit: 2013-06-06 | Discharge: 2013-06-06 | Disposition: A | Discharge status: Home / Self Care | Payer: Self-pay | Attending: Emergency Medicine | Admitting: Emergency Medicine

## 2013-06-06 ENCOUNTER — Encounter (HOSPITAL_COMMUNITY): Payer: Self-pay | Admitting: Emergency Medicine

## 2013-06-06 DIAGNOSIS — Z8709 Personal history of other diseases of the respiratory system: Secondary | ICD-10-CM | POA: Insufficient documentation

## 2013-06-06 DIAGNOSIS — Z8619 Personal history of other infectious and parasitic diseases: Secondary | ICD-10-CM | POA: Insufficient documentation

## 2013-06-06 DIAGNOSIS — Y929 Unspecified place or not applicable: Secondary | ICD-10-CM | POA: Insufficient documentation

## 2013-06-06 DIAGNOSIS — T401X4A Poisoning by heroin, undetermined, initial encounter: Secondary | ICD-10-CM | POA: Insufficient documentation

## 2013-06-06 DIAGNOSIS — T401X1A Poisoning by heroin, accidental (unintentional), initial encounter: Secondary | ICD-10-CM | POA: Insufficient documentation

## 2013-06-06 DIAGNOSIS — R112 Nausea with vomiting, unspecified: Secondary | ICD-10-CM | POA: Insufficient documentation

## 2013-06-06 DIAGNOSIS — F17200 Nicotine dependence, unspecified, uncomplicated: Secondary | ICD-10-CM | POA: Insufficient documentation

## 2013-06-06 DIAGNOSIS — Y9389 Activity, other specified: Secondary | ICD-10-CM | POA: Insufficient documentation

## 2013-06-06 LAB — COMPREHENSIVE METABOLIC PANEL
AST: 36 U/L (ref 0–37)
BUN: 15 mg/dL (ref 6–23)
CO2: 23 mEq/L (ref 19–32)
Calcium: 8.3 mg/dL — ABNORMAL LOW (ref 8.4–10.5)
Chloride: 103 mEq/L (ref 96–112)
Creatinine, Ser: 0.77 mg/dL (ref 0.50–1.35)
GFR calc Af Amer: >90 mL/min (ref >90)
GFR calc non Af Amer: >90 mL/min (ref >90)
Sodium: 138 mEq/L (ref 135–145)
Total Bilirubin: 0.3 mg/dL (ref 0.3–1.2)

## 2013-06-06 LAB — CBC
HCT: 39.4 % (ref 39.0–52.0)
MCH: 31.8 pg (ref 26.0–34.0)
MCV: 87.8 fL (ref 78.0–100.0)
Platelets: 158 10*3/uL (ref 150–400)
RBC: 4.49 MIL/uL (ref 4.22–5.81)
RDW: 14.4 % (ref 11.5–15.5)
WBC: 7.4 10*3/uL (ref 4.0–10.5)

## 2013-06-06 LAB — RAPID URINE DRUG SCREEN, HOSP PERFORMED
Barbiturates: NOT DETECTED
Benzodiazepines: NOT DETECTED
Cocaine: NOT DETECTED
Opiates: POSITIVE — AB

## 2013-06-06 MED ORDER — ONDANSETRON 4 MG PO TBDP
4.0000 mg | ORAL_TABLET | Freq: Once | ORAL | Status: AC
  Administered 2013-06-06: 4 mg via ORAL
  Filled 2013-06-06: qty 1

## 2013-06-06 NOTE — ED Provider Notes (Signed)
Medical screening examination/treatment/procedure(s) were performed by non-physician practitioner and as supervising physician I was immediately available for consultation/collaboration.  EKG Interpretation   None         Dagmar Hait, MD 06/06/13 [phone]

## 2013-06-06 NOTE — ED Provider Notes (Signed)
CSN: [id number]     Arrival date & time 06/06/13  2112 History   First MD Initiated Contact with Patient 06/06/13 2114     Chief Complaint  Patient presents with  . Drug Overdose   (Consider location/radiation/quality/duration/timing/severity/associated sxs/prior Treatment) HPI Comments: Patient is a 32 year old male with a past medical history of drug abuse who presents to the emergency department after overdosing on heroin. Patient was at a party, bought $80 worth of heroine, unsure how much he injected, and the next thing he knew he ended up in the emergency department. States he has been clean since last being in the emergency department last month, he is up from [place] visiting a friend and came in contact with "the wrong people". EMS was called because patient was unresponsive, unable to arrival he appeared ashen in color with shallow breathing. He was given 2 mg of Narcan at 2044, immediately woke up and was alert and oriented. He then vomited once. Currently he is feeling slightly nauseated. Had some alcohol tonight, denies any other drug use. Denies suicidal or homicidal ideations. He will be returning to [place] tomorrow.  Patient is a 32 y.o. male presenting with Overdose. The history is provided by the patient and the EMS personnel.  Drug Overdose Associated symptoms include nausea.    Past Medical History  Diagnosis Date  . Pneumothorax   . Shingles    Past Surgical History  Procedure Laterality Date  . Fracture surgery      right hand fracture repaired with plate 4098  . Hernia repair      as a child   No family history on file. History  Substance Use Topics  . Smoking status: Current Every Day Smoker -- 1.00 packs/day for 17 years    Types: Cigarettes  . Smokeless tobacco: Former Neurosurgeon    Quit date: 02/17/2012  . Alcohol Use: Yes     Comment: 4-5 fifths/wk    Review of Systems  Gastrointestinal: Positive for nausea.  All other systems reviewed and are  negative.    Allergies  Bee venom and Haldol  Home Medications   Current Outpatient Rx  Name  Route  Sig  Dispense  Refill  . naproxen sodium (ANAPROX) 220 MG tablet   Oral   Take 220 mg by mouth 2 (two) times daily as needed (pain).          BP 116/75  Pulse 66  Temp(Src) 97.9 F (36.6 C) (Oral)  Resp 17  SpO2 98% Physical Exam  Nursing note and vitals reviewed. Constitutional: He is oriented to person, place, and time. He appears well-developed and well-nourished. No distress.  HENT:  Head: Normocephalic and atraumatic.  Mouth/Throat: Oropharynx is clear and moist.  Eyes: Conjunctivae and EOM are normal. Pupils are equal, round, and reactive to light.  Neck: Normal range of motion. Neck supple.  Cardiovascular: Normal rate, regular rhythm and normal heart sounds.   Pulmonary/Chest: Effort normal and breath sounds normal.  Abdominal: Soft. Bowel sounds are normal. There is no tenderness.  Musculoskeletal: Normal range of motion. He exhibits no edema.  Neurological: He is alert and oriented to person, place, and time. He has normal strength. No cranial nerve deficit or sensory deficit. GCS eye subscore is 4. GCS verbal subscore is 5. GCS motor subscore is 6.  Skin: Skin is warm and dry. He is not diaphoretic.  Psychiatric: He has a normal mood and affect. His speech is normal and behavior is normal. He expresses no homicidal  and no suicidal ideation.    ED Course  Procedures (including critical care time) Labs Review Labs Reviewed  CBC - Abnormal; Notable for the following:    MCHC 36.3 (*)    All other components within normal limits  COMPREHENSIVE METABOLIC PANEL - Abnormal; Notable for the following:    Glucose, Bld 128 (*)    Calcium 8.3 (*)    All other components within normal limits  URINE RAPID DRUG SCREEN (HOSP PERFORMED) - Abnormal; Notable for the following:    Opiates POSITIVE (*)    Tetrahydrocannabinol POSITIVE (*)    All other components within  normal limits  ETHANOL - Abnormal; Notable for the following:    Alcohol, Ethyl (B) 155 (*)    All other components within normal limits   Imaging Review No results found.  EKG Interpretation   None       MDM   1. Heroin overdose, initial encounter   2. Drug overdose, initial encounter     Pt presenting after OD on heroine. Well appearing, NAD, AAOx3. Denies SI/HI. Given narcan via EMS. Labs pending. Plan to monitor patient, discharge home. 11:25 PM Pt remains stable for 2 hours, AAOx3, VSS. Stable for discharge. He has resources back in [place] for help with substance abuse. Return precautions given. Patient states understanding of treatment care plan and is agreeable. Case discussed with attending Dr. Gwendolyn Grant who agrees with plan of care.   Trevor Mace, PA-C 06/06/13 2326

## 2013-06-06 NOTE — ED Notes (Signed)
Pt BIB EMS. Pt told EMS that he took some heroin tonight and overdosed. Pt told EMS that he had been clean for a while and relapsed tonight. Pt's girlfriend called EMS. Upon EMS arrival pt was unresponsive, ashen in color and had shallow breathing. Pt was given Narcan 2mg  by EMS @ 2044. Pt woke up and was a/o x 4. Pt has vomited once. Pt arrives in no acute distress. Skin warm, dry.

## 2013-11-13 ENCOUNTER — Encounter (HOSPITAL_COMMUNITY): Payer: Self-pay | Admitting: Emergency Medicine

## 2013-11-13 ENCOUNTER — Inpatient Hospital Stay (HOSPITAL_COMMUNITY)
Admission: EM | Admit: 2013-11-13 | Discharge: 2013-11-16 | DRG: 896 | Disposition: A | Discharge status: Home / Self Care | Payer: Self-pay | Attending: Family Medicine | Admitting: Family Medicine

## 2013-11-13 DIAGNOSIS — K920 Hematemesis: Secondary | ICD-10-CM | POA: Diagnosis present

## 2013-11-13 DIAGNOSIS — F1994 Other psychoactive substance use, unspecified with psychoactive substance-induced mood disorder: Secondary | ICD-10-CM

## 2013-11-13 DIAGNOSIS — E860 Dehydration: Secondary | ICD-10-CM | POA: Diagnosis present

## 2013-11-13 DIAGNOSIS — E871 Hypo-osmolality and hyponatremia: Secondary | ICD-10-CM | POA: Diagnosis present

## 2013-11-13 DIAGNOSIS — E876 Hypokalemia: Secondary | ICD-10-CM | POA: Diagnosis present

## 2013-11-13 DIAGNOSIS — F17200 Nicotine dependence, unspecified, uncomplicated: Secondary | ICD-10-CM | POA: Diagnosis present

## 2013-11-13 DIAGNOSIS — F1193 Opioid use, unspecified with withdrawal: Secondary | ICD-10-CM

## 2013-11-13 DIAGNOSIS — E861 Hypovolemia: Secondary | ICD-10-CM | POA: Diagnosis present

## 2013-11-13 DIAGNOSIS — K625 Hemorrhage of anus and rectum: Secondary | ICD-10-CM | POA: Diagnosis present

## 2013-11-13 DIAGNOSIS — Z681 Body mass index (BMI) 19 or less, adult: Secondary | ICD-10-CM

## 2013-11-13 DIAGNOSIS — R5383 Other fatigue: Secondary | ICD-10-CM

## 2013-11-13 DIAGNOSIS — K5900 Constipation, unspecified: Secondary | ICD-10-CM | POA: Diagnosis present

## 2013-11-13 DIAGNOSIS — Z6379 Other stressful life events affecting family and household: Secondary | ICD-10-CM

## 2013-11-13 DIAGNOSIS — R1115 Cyclical vomiting syndrome unrelated to migraine: Secondary | ICD-10-CM | POA: Diagnosis present

## 2013-11-13 DIAGNOSIS — F1290 Cannabis use, unspecified, uncomplicated: Secondary | ICD-10-CM | POA: Diagnosis present

## 2013-11-13 DIAGNOSIS — F19939 Other psychoactive substance use, unspecified with withdrawal, unspecified: Principal | ICD-10-CM | POA: Diagnosis present

## 2013-11-13 DIAGNOSIS — L03114 Cellulitis of left upper limb: Secondary | ICD-10-CM | POA: Diagnosis present

## 2013-11-13 DIAGNOSIS — F1020 Alcohol dependence, uncomplicated: Secondary | ICD-10-CM

## 2013-11-13 DIAGNOSIS — F1010 Alcohol abuse, uncomplicated: Secondary | ICD-10-CM | POA: Diagnosis present

## 2013-11-13 DIAGNOSIS — R5381 Other malaise: Secondary | ICD-10-CM | POA: Diagnosis present

## 2013-11-13 DIAGNOSIS — IMO0002 Reserved for concepts with insufficient information to code with codable children: Secondary | ICD-10-CM | POA: Diagnosis present

## 2013-11-13 DIAGNOSIS — F1210 Cannabis abuse, uncomplicated: Secondary | ICD-10-CM | POA: Diagnosis present

## 2013-11-13 DIAGNOSIS — R1110 Vomiting, unspecified: Secondary | ICD-10-CM | POA: Diagnosis present

## 2013-11-13 DIAGNOSIS — Z720 Tobacco use: Secondary | ICD-10-CM | POA: Diagnosis present

## 2013-11-13 DIAGNOSIS — F1110 Opioid abuse, uncomplicated: Secondary | ICD-10-CM

## 2013-11-13 DIAGNOSIS — E43 Unspecified severe protein-calorie malnutrition: Secondary | ICD-10-CM | POA: Diagnosis present

## 2013-11-13 DIAGNOSIS — F1123 Opioid dependence with withdrawal: Secondary | ICD-10-CM | POA: Diagnosis present

## 2013-11-13 DIAGNOSIS — F1120 Opioid dependence, uncomplicated: Secondary | ICD-10-CM | POA: Diagnosis present

## 2013-11-13 HISTORY — DX: Opioid abuse, uncomplicated: F11.10

## 2013-11-13 LAB — CBC
HEMATOCRIT: 40.6 % (ref 39.0–52.0)
Hemoglobin: 15.8 g/dL (ref 13.0–17.0)
MCH: 30.8 pg (ref 26.0–34.0)
MCHC: 38.9 g/dL — AB (ref 30.0–36.0)
MCV: 79.1 fL (ref 78.0–100.0)
Platelets: 260 10*3/uL (ref 150–400)
RBC: 5.13 MIL/uL (ref 4.22–5.81)
RDW: 11 % — ABNORMAL LOW (ref 11.5–15.5)
WBC: 11.6 10*3/uL — ABNORMAL HIGH (ref 4.0–10.5)

## 2013-11-13 LAB — RAPID URINE DRUG SCREEN, HOSP PERFORMED
AMPHETAMINES: NOT DETECTED
BENZODIAZEPINES: NOT DETECTED
Barbiturates: NOT DETECTED
Cocaine: NOT DETECTED
OPIATES: POSITIVE — AB
Tetrahydrocannabinol: POSITIVE — AB

## 2013-11-13 LAB — MAGNESIUM: MAGNESIUM: 2.2 mg/dL (ref 1.5–2.5)

## 2013-11-13 LAB — COMPREHENSIVE METABOLIC PANEL
ALBUMIN: 4.8 g/dL (ref 3.5–5.2)
ALK PHOS: 104 U/L (ref 39–117)
ALT: 19 U/L (ref 0–53)
AST: 19 U/L (ref 0–37)
BUN: 20 mg/dL (ref 6–23)
CO2: 29 mEq/L (ref 19–32)
Calcium: 10.5 mg/dL (ref 8.4–10.5)
Chloride: 67 mEq/L — ABNORMAL LOW (ref 96–112)
Creatinine, Ser: 1.03 mg/dL (ref 0.50–1.35)
GFR calc Af Amer: >90 mL/min (ref >90)
GFR calc non Af Amer: >90 mL/min (ref >90)
Glucose, Bld: 136 mg/dL — ABNORMAL HIGH (ref 70–99)
POTASSIUM: 2.4 meq/L — AB (ref 3.7–5.3)
Sodium: 117 mEq/L — CL (ref 137–147)
Total Bilirubin: 1.6 mg/dL — ABNORMAL HIGH (ref 0.3–1.2)
Total Protein: 8.7 g/dL — ABNORMAL HIGH (ref 6.0–8.3)

## 2013-11-13 LAB — LIPASE, BLOOD: Lipase: 91 U/L — ABNORMAL HIGH (ref 11–59)

## 2013-11-13 LAB — ETHANOL: Alcohol, Ethyl (B): <11 mg/dL (ref 0–11)

## 2013-11-13 MED ORDER — ACETAMINOPHEN 325 MG PO TABS
650.0000 mg | ORAL_TABLET | Freq: Four times a day (QID) | ORAL | Status: DC | PRN
  Administered 2013-11-14 – 2013-11-16 (×7): 650 mg via ORAL
  Filled 2013-11-13 (×8): qty 2

## 2013-11-13 MED ORDER — SODIUM CHLORIDE 0.9 % IV SOLN
1000.0000 mL | Freq: Once | INTRAVENOUS | Status: AC
  Administered 2013-11-13: 1000 mL via INTRAVENOUS

## 2013-11-13 MED ORDER — SODIUM CHLORIDE 0.9 % IV SOLN: 1000.0000 mL | INTRAVENOUS | Status: DC

## 2013-11-13 MED ORDER — ONDANSETRON 4 MG PO TBDP
4.0000 mg | ORAL_TABLET | Freq: Four times a day (QID) | ORAL | Status: DC | PRN
  Administered 2013-11-13 – 2013-11-15 (×3): 4 mg via ORAL
  Filled 2013-11-13 (×3): qty 1

## 2013-11-13 MED ORDER — SODIUM CHLORIDE 0.9 % IV SOLN: 1000.0000 mL | Freq: Once | INTRAVENOUS | Status: DC

## 2013-11-13 MED ORDER — METHOCARBAMOL 500 MG PO TABS: 500.0000 mg | ORAL_TABLET | Freq: Three times a day (TID) | ORAL | Status: DC | PRN

## 2013-11-13 MED ORDER — SODIUM CHLORIDE 0.9 % IJ SOLN: 3.0000 mL | Freq: Two times a day (BID) | INTRAMUSCULAR | Status: DC

## 2013-11-13 MED ORDER — POTASSIUM CHLORIDE IN NACL 40-0.9 MEQ/L-% IV SOLN
INTRAVENOUS | Status: DC
  Administered 2013-11-13 – 2013-11-14 (×3): via INTRAVENOUS
  Administered 2013-11-15: 50 mL/h via INTRAVENOUS
  Administered 2013-11-15: 02:00:00 via INTRAVENOUS
  Filled 2013-11-13 (×7): qty 1000

## 2013-11-13 MED ORDER — POTASSIUM CHLORIDE 10 MEQ/100ML IV SOLN
10.0000 meq | INTRAVENOUS | Status: AC
  Administered 2013-11-13 – 2013-11-14 (×3): 10 meq via INTRAVENOUS
  Filled 2013-11-13 (×3): qty 100

## 2013-11-13 MED ORDER — LOPERAMIDE HCL 2 MG PO CAPS: 2.0000 mg | ORAL_CAPSULE | ORAL | Status: DC | PRN

## 2013-11-13 MED ORDER — VANCOMYCIN HCL IN DEXTROSE 1-5 GM/200ML-% IV SOLN
1000.0000 mg | Freq: Two times a day (BID) | INTRAVENOUS | Status: DC
  Administered 2013-11-14: 1000 mg via INTRAVENOUS
  Filled 2013-11-13 (×3): qty 200

## 2013-11-13 MED ORDER — DICYCLOMINE HCL 20 MG PO TABS
20.0000 mg | ORAL_TABLET | Freq: Four times a day (QID) | ORAL | Status: DC | PRN
  Administered 2013-11-14 – 2013-11-15 (×4): 20 mg via ORAL
  Filled 2013-11-13 (×4): qty 1

## 2013-11-13 MED ORDER — CLONIDINE HCL 0.1 MG PO TABS: 0.1000 mg | ORAL_TABLET | Freq: Every day | ORAL | Status: DC

## 2013-11-13 MED ORDER — POTASSIUM CHLORIDE CRYS ER 20 MEQ PO TBCR
40.0000 meq | EXTENDED_RELEASE_TABLET | Freq: Once | ORAL | Status: AC
  Administered 2013-11-13: 40 meq via ORAL
  Filled 2013-11-13: qty 2

## 2013-11-13 MED ORDER — ALUM & MAG HYDROXIDE-SIMETH 200-200-20 MG/5ML PO SUSP: 30.0000 mL | Freq: Four times a day (QID) | ORAL | Status: DC | PRN

## 2013-11-13 MED ORDER — ACETAMINOPHEN 650 MG RE SUPP: 650.0000 mg | Freq: Four times a day (QID) | RECTAL | Status: DC | PRN

## 2013-11-13 MED ORDER — CLONIDINE HCL 0.1 MG PO TABS
0.1000 mg | ORAL_TABLET | Freq: Four times a day (QID) | ORAL | Status: DC
  Administered 2013-11-14 – 2013-11-16 (×8): 0.1 mg via ORAL
  Filled 2013-11-13 (×10): qty 1

## 2013-11-13 MED ORDER — HYDROXYZINE HCL 25 MG PO TABS
25.0000 mg | ORAL_TABLET | Freq: Four times a day (QID) | ORAL | Status: DC | PRN
  Filled 2013-11-13: qty 1

## 2013-11-13 MED ORDER — ZOLPIDEM TARTRATE 5 MG PO TABS
5.0000 mg | ORAL_TABLET | Freq: Every evening | ORAL | Status: DC | PRN
  Administered 2013-11-13 – 2013-11-15 (×3): 5 mg via ORAL
  Filled 2013-11-13 (×3): qty 1

## 2013-11-13 MED ORDER — NICOTINE 21 MG/24HR TD PT24
21.0000 mg | MEDICATED_PATCH | Freq: Every day | TRANSDERMAL | Status: DC
  Administered 2013-11-13 – 2013-11-16 (×4): 21 mg via TRANSDERMAL
  Filled 2013-11-13 (×4): qty 1

## 2013-11-13 MED ORDER — PANTOPRAZOLE SODIUM 40 MG PO TBEC
40.0000 mg | DELAYED_RELEASE_TABLET | Freq: Every day | ORAL | Status: DC
  Administered 2013-11-14 – 2013-11-16 (×3): 40 mg via ORAL
  Filled 2013-11-13 (×3): qty 1

## 2013-11-13 MED ORDER — NAPROXEN 500 MG PO TABS: 500.0000 mg | ORAL_TABLET | Freq: Two times a day (BID) | ORAL | Status: DC | PRN

## 2013-11-13 MED ORDER — KETOROLAC TROMETHAMINE 30 MG/ML IJ SOLN
30.0000 mg | Freq: Once | INTRAMUSCULAR | Status: AC
  Administered 2013-11-13: 30 mg via INTRAVENOUS
  Filled 2013-11-13: qty 1

## 2013-11-13 MED ORDER — ONDANSETRON HCL 4 MG/2ML IJ SOLN
4.0000 mg | Freq: Once | INTRAMUSCULAR | Status: AC
Start: 2013-11-13 — End: 2013-11-13
  Administered 2013-11-13: 4 mg via INTRAVENOUS
  Filled 2013-11-13: qty 2

## 2013-11-13 MED ORDER — CLONIDINE HCL 0.1 MG PO TABS
0.1000 mg | ORAL_TABLET | ORAL | Status: DC
  Administered 2013-11-13: 0.1 mg via ORAL
  Filled 2013-11-13 (×2): qty 1

## 2013-11-13 NOTE — ED Provider Notes (Signed)
CSN: [id number]     Arrival date & time 11/13/13  1926 History   First MD Initiated Contact with Patient 11/13/13 1947     Chief Complaint  Patient presents with  . Abdominal Pain  . Emesis      HPI Patient reports he has been trying to stop heroin over the past 2 and half weeks.  Having some difficulty with this.  He develop worsening nausea and vomiting and generalized abdominal pain over the past 24-48 hours.  Also had some diarrhea.  He reports feeling lightheaded when he stands up and walks around.  He states he feels weak.  He has no other significant complaints.  He's been in rehabilitation before for opioid abuse.   Past Medical History  Diagnosis Date  . Pneumothorax   . Shingles   . Heroin abuse    Past Surgical History  Procedure Laterality Date  . Fracture surgery      right hand fracture repaired with plate 3343  . Hernia repair      as a child   No family history on file. History  Substance Use Topics  . Smoking status: Current Every Day Smoker -- 1.00 packs/day for 17 years    Types: Cigarettes  . Smokeless tobacco: Former Neurosurgeon    Quit date: 02/17/2012  . Alcohol Use: Yes     Comment: 4-5 fifths/wk    Review of Systems  All other systems reviewed and are negative.     Allergies  Bee venom and Haldol  Home Medications   Prior to Admission medications   Medication Sig Start Date End Date Taking? Authorizing Provider  naproxen sodium (ANAPROX) 220 MG tablet Take 220 mg by mouth 2 (two) times daily as needed (pain).   Yes Historical Provider, MD  ranitidine (ZANTAC) 150 MG tablet Take 150 mg by mouth daily as needed for heartburn (heart burn).   Yes Historical Provider, MD   BP 124/73  Pulse 94  Temp(Src) 97.7 F (36.5 C) (Oral)  Resp 21  Ht 6\' 5"  (1.956 m)  Wt 130 lb (58.968 kg)  BMI 15.41 kg/m2  SpO2 100% Physical Exam  Nursing note and vitals reviewed. Constitutional: He is oriented to person, place, and time. He appears well-developed  and well-nourished.  HENT:  Head: Normocephalic and atraumatic.  Dry mucous membranes  Eyes: EOM are normal.  Neck: Normal range of motion.  Cardiovascular: Normal rate, regular rhythm, normal heart sounds and intact distal pulses.   Pulmonary/Chest: Effort normal and breath sounds normal. No respiratory distress.  Abdominal: Soft. He exhibits no distension. There is no tenderness.  Musculoskeletal: Normal range of motion.  Neurological: He is alert and oriented to person, place, and time.  Skin: Skin is warm and dry.  Psychiatric: He has a normal mood and affect. Judgment normal.    ED Course  Procedures (including critical care time) Labs Review Labs Reviewed  COMPREHENSIVE METABOLIC PANEL - Abnormal; Notable for the following:    Sodium 117 (*)    Potassium 2.4 (*)    Chloride 67 (*)    Glucose, Bld 136 (*)    Total Protein 8.7 (*)    Total Bilirubin 1.6 (*)    All other components within normal limits  ETHANOL  CBC  URINE RAPID DRUG SCREEN (HOSP PERFORMED)    Imaging Review No results found.   EKG Interpretation None      MDM   Final diagnoses:  None    Patient with profound hypokalemia with  a potassium of 2.4.  Is also hyponatremic with a sodium of 117.  Patient be admitted the hospital for ongoing IV fluids and management of his symptoms.  His electrolytes will need to be repleted until he will be able to be placed at a detox facility    Lyanne CoKevin M Hayle Parisi, MD 11/13/13 2157

## 2013-11-13 NOTE — H&P (Addendum)
[facility] History and Physical  Dennison Labombard GBT:[id number] DOB: [birth year] DOA: 11/13/2013  Referring physician: Patria Mane PCP: none   Chief Complaint: weakness, intractable vomiting  HPI: Joseph Duarte is a 33 y.o. male  Presents to the ED with intractable vomiting and weakness due to heroin withdrawal. He has been trying to quit using for 2 weeks. Since then, he's eaten or drank very little, has been vomiting dozens of times a day, sometimes bloody emesis. No melena or hematochezia. He was so weak he was barely able to walk, so called a friend to bring him to the emergency room. He bought some Suboxone on the street without improvement in his symptoms, so he sought help here. He has been using heroin on and off since teenage years. He has been to behavioral [facility], [facility], [place] for drug treatment. He has a history of heavy alcohol use, but has not drank for several months. He uses marijuana occasionally. He is not asking for opiates. In the emergency room, sodium noted to be 117, potassium 2.4, chloride 67. CBC is pending. He has received several liters of fluid, anti-emetics, and is getting potassium repletion. Already he is feeling much better. He does not share needles. Last HIV test 2 years ago which was negative. He found out that his previous partner has hepatitis C and would like to get tested for HIV and hepatitis C. Has lost what he estimates is about 40 pounds recently.   Review of Systems:  Systems review. As above otherwise negative.  Past Medical History  Diagnosis Date  . Pneumothorax   . Shingles   . Heroin abuse    Past Surgical History  Procedure Laterality Date  . Fracture surgery      right hand fracture repaired with plate 7106  . Hernia repair      as a child   Social History: Inject heroin. "Dabbles other drugs" rrely. Has not drank alcohol for several months. smokes one pack of cigarettes every 2-3 days. Is expecting a child and will  be moving down to [place]. Currently staying in a month L.  Allergies  Allergen Reactions  . Bee Venom Anaphylaxis  . Haldol [Haloperidol Lactate] Other (See Comments)    EPS (skeletal seizure)   Family history: Recently lost several family members in a car wreck. 2 uncles with substance abuse problems   Prior to Admission medications   Medication Sig Start Date End Date Taking? Authorizing Provider  naproxen sodium (ANAPROX) 220 MG tablet Take 220 mg by mouth 2 (two) times daily as needed (pain).   Yes Historical Provider, MD  ranitidine (ZANTAC) 150 MG tablet Take 150 mg by mouth daily as needed for heartburn (heart burn).   Yes Historical Provider, MD   Physical Exam: Filed Vitals:   11/13/13 2133  BP: 124/73  Pulse: 94  Temp:   Resp: 21  BP 124/73  Pulse 94  Temp(Src) 97.7 F (36.5 C) (Oral)  Resp 21  Ht 6\' 5"  (1.956 m)  Wt 58.968 kg (130 lb)  BMI 15.41 kg/m2  SpO2 100%  General Appearance:    Alert, cooperative, no distress, appears stated age.thin   Head:    Normocephalic, without obvious abnormality, atraumatic  Eyes:    PERRL, conjunctiva/corneas clear, EOM's intact, fundi    benign, both eyes     Nose:   Nares normal, septum midline, mucosa normal, no drainage    or sinus tenderness  Throat:   slightly dry mucous membranes after 2 L  of fluid.   Neck:   Supple, symmetrical, trachea midline, no adenopathy;    thyroid:  no enlargement/tenderness/nodules; no carotid   bruit or JVD  Back:     Symmetric, no curvature, ROM normal, no CVA tenderness  Lungs:     Clear to auscultation bilaterally, respirations unlabored  Chest Wall:    No tenderness or deformity   Heart:    Regular rate and rhythm, S1 and S2 normal, no murmur, rub   or gallop     Abdomen:     Soft, non-tender, bowel sounds active all four quadrants,    no masses, no organomegaly  Genitalia:   deferred   Rectal:   deferred   Extremities:   left antecubital fossa with track marks in slight erythema  and induration. Track marks along the right arm as well.   Pulses:   2+ and symmetric all extremities  Skin:   see above. Multiple tattoos   Lymph nodes:   Cervical, supraclavicular, and axillary nodes normal  Neurologic:   CNII-XII intact, normal strength, sensation and reflexes    throughout           Psych: Calm and cooperative.   Labs on Admission:  Basic Metabolic Panel:  Recent Labs Lab 11/13/13 2005  NA 117*  K 2.4*  CL 67*  CO2 29  GLUCOSE 136*  BUN 20  CREATININE 1.03  CALCIUM 10.5   Liver Function Tests:  Recent Labs Lab 11/13/13 2005  AST 19  ALT 19  ALKPHOS 104  BILITOT 1.6*  PROT 8.7*  ALBUMIN 4.8   No results found for this basename: LIPASE, AMYLASE,  in the last 168 hours No results found for this basename: AMMONIA,  in the last 168 hours CBC:  Recent Labs Lab 11/13/13 2005  WBC 11.6*  HGB 15.8  HCT 40.6  MCV 79.1  PLT 260   Cardiac Enzymes: No results found for this basename: CKTOTAL, CKMB, CKMBINDEX, TROPONINI,  in the last 168 hours  BNP (last 3 results) No results found for this basename: PROBNP,  in the last 8760 hours CBG: No results found for this basename: GLUCAP,  in the last 168 hours  Radiological Exams on Admission: No results found.  EKG:   Assessment/Plan Principal Problem:   Hypokalemia:  Secondary to vomiting. Replete and check magnesium Active Problems:  Heroin dependence: clonidine protocol has been started. Supportive care. Will consult social work to assist with treatment options. Check HIV and hep panel. Dehydration   Intractable vomiting with reported small volume hematemesis: IVF, antiemetics, f/u CBC. Check lipase. LFTs ok   Hyponatremia, hypovolemic   Cellulitis of left arm, mild.  IV vancomycin until tolerating PO   Tobacco abuse: nicoderm patch   Marijuana use      Code Status: full Family Communication: Disposition Plan:   Time spent: 50 min  Christiane Haorinna L Gentri Guardado, MD [facility] Pager  [phone]

## 2013-11-13 NOTE — ED Notes (Addendum)
Pt c/o generalized abd pain with  Emesis x 20+ in last 24 hours. Pt last used heroin last evening. Pt reports lightheadedness and near syncope

## 2013-11-13 NOTE — Progress Notes (Signed)
ANTIBIOTIC CONSULT NOTE - INITIAL  Pharmacy Consult for Vancomycin Indication: Cellulitis  Allergies  Allergen Reactions  . Bee Venom Anaphylaxis  . Haldol [Haloperidol Lactate] Other (See Comments)    EPS (skeletal seizure)    Patient Measurements: Height: 6\' 4"  (193 cm) Weight: 152 lb 4.8 oz (69.083 kg) IBW/kg (Calculated) : 86.8  Vital Signs: Temp: 98.4 F (36.9 C) (05/24 2312) Temp src: Oral (05/24 2312) BP: 131/82 mmHg (05/24 2312) Pulse Rate: 78 (05/24 2312) Intake/Output from previous day:   Intake/Output from this shift:    Labs:  Recent Labs  11/13/13 2005  WBC 11.6*  HGB 15.8  PLT 260  CREATININE 1.03   Estimated Creatinine Clearance: 98.8 ml/min (by C-G formula based on Cr of 1.03). No results found for this basename: VANCOTROUGH, VANCOPEAK, VANCORANDOM, GENTTROUGH, GENTPEAK, GENTRANDOM, TOBRATROUGH, TOBRAPEAK, TOBRARND, AMIKACINPEAK, AMIKACINTROU, AMIKACIN,  in the last 72 hours   Microbiology: No results found for this or any previous visit (from the past 720 hour(s)).  Medical History: Past Medical History  Diagnosis Date  . Pneumothorax   . Shingles   . Heroin abuse     Medications:  Scheduled:  . cloNIDine  0.1 mg Oral QID   Followed by  . [START ON 11/16/2013] cloNIDine  0.1 mg Oral BH-qamhs   Followed by  . [START ON 11/19/2013] cloNIDine  0.1 mg Oral QAC breakfast  . nicotine  21 mg Transdermal Daily  . [START ON 11/14/2013] pantoprazole  40 mg Oral Daily  . potassium chloride  10 mEq Intravenous Q1 Hr x 3  . sodium chloride  3 mL Intravenous Q12H   Infusions:  . 0.9 % NaCl with KCl 40 mEq / L     Assessment:  33 yr male with vomiting/weakness due to heroin withdrawal.  Pt with mild cellulitis of left arm. Plan for IV vanc until pt can tolerate PO meds  CrCl ~ 98 ml/min  Goal of Therapy:  Vancomycin trough level 10-15 mcg/ml  Plan:  Measure antibiotic drug levels at steady state Follow up culture results Vancomycin 1gm IV  q12h  Maryellen Pile, PharmD 11/13/2013,11:25 PM

## 2013-11-14 DIAGNOSIS — F1193 Opioid use, unspecified with withdrawal: Secondary | ICD-10-CM | POA: Diagnosis present

## 2013-11-14 DIAGNOSIS — E43 Unspecified severe protein-calorie malnutrition: Secondary | ICD-10-CM | POA: Insufficient documentation

## 2013-11-14 DIAGNOSIS — F1123 Opioid dependence with withdrawal: Secondary | ICD-10-CM | POA: Diagnosis present

## 2013-11-14 LAB — HEPATITIS PANEL, ACUTE
HCV AB: NEGATIVE
HEP B C IGM: NONREACTIVE
HEP B S AG: NEGATIVE
Hep A IgM: NONREACTIVE

## 2013-11-14 LAB — BASIC METABOLIC PANEL
BUN: 17 mg/dL (ref 6–23)
CALCIUM: 8.7 mg/dL (ref 8.4–10.5)
CO2: 31 mEq/L (ref 19–32)
CREATININE: 0.9 mg/dL (ref 0.50–1.35)
Chloride: 82 mEq/L — ABNORMAL LOW (ref 96–112)
GFR calc non Af Amer: >90 mL/min (ref >90)
Glucose, Bld: 93 mg/dL (ref 70–99)
Potassium: 2.8 mEq/L — CL (ref 3.7–5.3)
Sodium: 125 mEq/L — ABNORMAL LOW (ref 137–147)

## 2013-11-14 LAB — CBC
HCT: 32.5 % — ABNORMAL LOW (ref 39.0–52.0)
Hemoglobin: 12.5 g/dL — ABNORMAL LOW (ref 13.0–17.0)
MCH: 38 pg — ABNORMAL HIGH (ref 26.0–34.0)
MCHC: 38 g/dL — ABNORMAL HIGH (ref 30.0–36.0)
MCV: 80.8 fL (ref 78.0–100.0)
PLATELETS: 221 10*3/uL (ref 150–400)
RBC: 4.02 MIL/uL — ABNORMAL LOW (ref 4.22–5.81)
RDW: 11.1 % — AB (ref 11.5–15.5)
WBC: 9.4 10*3/uL (ref 4.0–10.5)

## 2013-11-14 MED ORDER — CEFAZOLIN SODIUM 1-5 GM-% IV SOLN
1.0000 g | Freq: Three times a day (TID) | INTRAVENOUS | Status: DC
  Administered 2013-11-14 – 2013-11-16 (×6): 1 g via INTRAVENOUS
  Filled 2013-11-14 (×7): qty 50

## 2013-11-14 MED ORDER — SENNA 8.6 MG PO TABS
1.0000 | ORAL_TABLET | Freq: Once | ORAL | Status: AC
  Administered 2013-11-14: 8.6 mg via ORAL
  Filled 2013-11-14: qty 1

## 2013-11-14 MED ORDER — POTASSIUM CHLORIDE 10 MEQ/100ML IV SOLN
10.0000 meq | INTRAVENOUS | Status: AC
  Administered 2013-11-14 (×4): 10 meq via INTRAVENOUS
  Filled 2013-11-14 (×4): qty 100

## 2013-11-14 MED ORDER — DIPHENHYDRAMINE HCL 25 MG PO CAPS
25.0000 mg | ORAL_CAPSULE | ORAL | Status: DC | PRN
  Administered 2013-11-14: 25 mg via ORAL
  Administered 2013-11-14: 50 mg via ORAL
  Filled 2013-11-14: qty 1
  Filled 2013-11-14: qty 2

## 2013-11-14 MED ORDER — KETOROLAC TROMETHAMINE 30 MG/ML IJ SOLN
30.0000 mg | Freq: Once | INTRAMUSCULAR | Status: AC
  Administered 2013-11-14: 30 mg via INTRAVENOUS
  Filled 2013-11-14: qty 1

## 2013-11-14 MED ORDER — ENSURE COMPLETE PO LIQD
237.0000 mL | Freq: Three times a day (TID) | ORAL | Status: DC
  Administered 2013-11-14 – 2013-11-15 (×5): 237 mL via ORAL

## 2013-11-14 MED ORDER — SODIUM CHLORIDE 0.9 % IV BOLUS (SEPSIS)
250.0000 mL | Freq: Once | INTRAVENOUS | Status: AC
  Administered 2013-11-14: 250 mL via INTRAVENOUS

## 2013-11-14 MED ORDER — POTASSIUM CHLORIDE CRYS ER 20 MEQ PO TBCR
40.0000 meq | EXTENDED_RELEASE_TABLET | Freq: Once | ORAL | Status: AC
  Administered 2013-11-14: 40 meq via ORAL
  Filled 2013-11-14: qty 2

## 2013-11-14 MED ORDER — ENOXAPARIN SODIUM 40 MG/0.4ML ~~LOC~~ SOLN
40.0000 mg | SUBCUTANEOUS | Status: DC
  Administered 2013-11-14: 40 mg via SUBCUTANEOUS
  Filled 2013-11-14: qty 0.4

## 2013-11-14 NOTE — Care Management Note (Addendum)
    Page 1 of 1   11/16/2013     11:32:57 AM CARE MANAGEMENT NOTE 11/16/2013  Patient:  Joseph Duarte, Joseph Duarte   Account Number:  [id number]  Date Initiated:  11/14/2013  Documentation initiated by:  Lanier Clam  Subjective/Objective Assessment:   33 Y/O M ADMITTED [facility].EO:FHQRFX ABUSE.     Action/Plan:   FROM HOME-LIVES IN [place].WILL RETURN BACK TO [place].HAS PCP,PHARMACY.   Anticipated DC Date:  11/16/2013   Anticipated DC Plan:  HOME/SELF CARE      DC Planning Services  CM consult      Choice offered to / List presented to:             Status of service:  Completed, signed off Medicare Important Message given?   (If response is "NO", the following Medicare IM given date fields will be blank) Date Medicare IM given:   Date Additional Medicare IM given:    Discharge Disposition:  HOME/SELF CARE  Per UR Regulation:  Reviewed for med. necessity/level of care/duration of stay  If discussed at Long Length of Stay Meetings, dates discussed:    Comments:  11/16/13 Dvora Buitron RN,BSN NCM [phone] D/C HOME NO NEEDS OR ORDERS.  11/15/13 Amanie Mcculley RN,BSN NCM [phone] PATIENT STATES HE HAS ASSETS, WILL GET PCP ONCE BACK IN [place].NO ANTICIPATED CM NEEDS.  11/14/13 Roshanna Cimino RN,BSN NCM [phone]

## 2013-11-14 NOTE — Progress Notes (Signed)
[facility] PROGRESS NOTE  Emanuell Claussen OIN:[id number] DOB: February 24, 1981 DOA: 11/13/2013 PCP: Default, Provider, MD  Assessment/Plan: Heroin Withdrawal -continue Clonidine protocol -supportive care, IVF , antiemetics, bentyl PRN  Heroin abuse -CSW consult -interested in quitting  Hypokalemia -replace IV and PO  Hyponatremia -due to vomiting, dehydration, volume depletion -hydrate with NS/KCL  Cellulitis -at site of self injection -Vancomycin, cold compresses  DVT proph: lovenox  Code Status: Full Code Family Communication: none at bedside Disposition Plan: home pending improvement   Antibiotics:  vancomycin  HPI/Subjective: No further nausea or vomiting, reports some tremors  Objective: Filed Vitals:   11/14/13 0556  BP: 110/65  Pulse: 63  Temp: 98.1 F (36.7 C)  Resp: 19    Intake/Output Summary (Last 24 hours) at 11/14/13 1028 Last data filed at 11/14/13 0700  Gross per 24 hour  Intake 1586.25 ml  Output      0 ml  Net 1586.25 ml   Filed Weights   11/13/13 1934 11/13/13 2312  Weight: 58.968 kg (130 lb) 59 kg (130 lb 1.1 oz)    Exam:   General:  AAOx3, no distress  Cardiovascular: S1S2/RRR  Respiratory:CTAB  Abdomen: soft, Nt, BS present  Musculoskeletal:no edema c/c, redness and swelling overlying the Anterior antecubital space   Needle marks on arm  Data Reviewed: Basic Metabolic Panel:  Recent Labs Lab 11/13/13 2005 11/14/13 0406  NA 117* 125*  K 2.4* 2.8*  CL 67* 82*  CO2 29 31  GLUCOSE 136* 93  BUN 20 17  CREATININE 1.03 0.90  CALCIUM 10.5 8.7  MG 2.2  --    Liver Function Tests:  Recent Labs Lab 11/13/13 2005  AST 19  ALT 19  ALKPHOS 104  BILITOT 1.6*  PROT 8.7*  ALBUMIN 4.8    Recent Labs Lab 11/13/13 2005  LIPASE 91*   No results found for this basename: AMMONIA,  in the last 168 hours CBC:  Recent Labs Lab 11/13/13 2005 11/14/13 0406  WBC 11.6* 9.4  HGB 15.8 12.5*  HCT 40.6 32.5*  MCV  79.1 80.8  PLT 260 221   Cardiac Enzymes: No results found for this basename: CKTOTAL, CKMB, CKMBINDEX, TROPONINI,  in the last 168 hours BNP (last 3 results) No results found for this basename: PROBNP,  in the last 8760 hours CBG: No results found for this basename: GLUCAP,  in the last 168 hours  No results found for this or any previous visit (from the past 240 hour(s)).   Studies: No results found.  Scheduled Meds: . cloNIDine  0.1 mg Oral QID   Followed by  . [START ON 11/16/2013] cloNIDine  0.1 mg Oral BH-qamhs   Followed by  . [START ON 11/19/2013] cloNIDine  0.1 mg Oral QAC breakfast  . nicotine  21 mg Transdermal Daily  . pantoprazole  40 mg Oral Daily  . potassium chloride  40 mEq Oral Once  . sodium chloride  3 mL Intravenous Q12H  . vancomycin  1,000 mg Intravenous Q12H   Continuous Infusions: . 0.9 % NaCl with KCl 40 mEq / L 125 mL/hr at 11/14/13 7096   Antibiotics Given (last 72 hours)   Date/Time Action Medication Dose Rate   11/14/13 0157 Given   vancomycin (VANCOCIN) IVPB 1000 mg/200 mL premix 1,000 mg 200 mL/hr      Principal Problem:   Hypokalemia Active Problems:   Opioid dependence   Cellulitis of left arm   Tobacco abuse   Marijuana use   Dehydration  Intractable vomiting   Hyponatremia   Time spent: 30min   Zannie CovePreetha Kylea Berrong  [facility] Pager [phone]. If 7PM-7AM, please contact night-coverage at [email or web address], password [email or web address] 11/14/2013, 10:28 AM  LOS: 1 day

## 2013-11-14 NOTE — Progress Notes (Signed)
Pt has blisters/hives on left leg.  Pt stated that he also had hives on bilateral legs and buttocks last night.  Reviewed medication list with patient.  Pt states that the only medication that he has not taken in the past is vanc and potassium.  MD made aware and orders received. Chaunta Bejarano A [facility]

## 2013-11-14 NOTE — Progress Notes (Signed)
INITIAL NUTRITION ASSESSMENT  DOCUMENTATION CODES Per approved criteria  -Severe  malnutrition in the context of social or environmental circumstances -Underweight   Pt meets criteria for severe MALNUTRITION in the context of social/environmental as evidenced by 30% wt loss x 9 months and energy intake </=50% of minimum estimated needs for >/= 1 month.   INTERVENTION: Ensure Complete po TID, each supplement providing 350 kcals and 13 grams of protein  Recommend Monitor magnesium, potassium, phosphorus daily for at least 3 days or throughout nutrition repletion, MD to replete as needed, as pt is at risk for refeeding syndrome given severe malnutrition RD to continue to follow nutrition care plan  NUTRITION DIAGNOSIS: Inadequate oral intake related to poor appetite, nausea/vomiting as evidenced by pt report of vomiting/poor intake for a couple of months.   Goal: Meet >/=90% of estimated nutrition needs   Monitor:  PO intake, supplement acceptance, weight trend, labs   Reason for Assessment: Positive Malnutrition Screening Tool Score   33 y.o. male  Admitting Dx: Hypokalemia  ASSESSMENT: Pt is a 33 y.o M presenting with intractable vomiting and weakness due to heroin withdrawal. Pt has been trying to quit using for 2 weeks. Pt has not been eating or drinking much since then.   Pt reports a weight loss of 30- 40 lbs in a couple of months. EPIC chart indicates a 30% weight loss x 9 months, which is severe for time frame.   Pt reports that every time he has eaten the past 2 weeks he has vomited. Pt states that he has not been eating well for several months and does not eat 3 meals/day. Pt not even able to tolerate water. Pt reports weakness.   Pt agreed to receiving Ensure supplements with meals. Encouraged pt to consume meals and supplements to prevent further weight loss.   Nutrition Focused Physical Exam:  Subcutaneous Fat:  Orbital Region: wnl Upper Arm Region: wnl Thoracic  and Lumbar Region: wnl  Muscle:  Temple Region: wnl Clavicle Bone Region: mild depletion Clavicle and Acromion Bone Region: mild depletion Scapular Bone Region: mild depletion  Dorsal Hand: n/a Patellar Region: n/a Anterior Thigh Region: n/a Posterior Calf Region: wnl  Edema: none   Low potassium- being repleted by MD  Mg WNL   Height: Ht Readings from Last 1 Encounters:  11/13/13 6\' 4"  (1.93 m)    Weight: Wt Readings from Last 1 Encounters:  11/13/13 130 lb 1.1 oz (59 kg)    Ideal Body Weight: 91.8 kg   % Ideal Body Weight: 64%   Wt Readings from Last 10 Encounters:  11/13/13 130 lb 1.1 oz (59 kg)  02/10/13 185 lb (83.915 kg)  02/17/12 150 lb (68.04 kg)  10/01/11 153 lb 3.2 oz (69.491 kg)  06/25/11 180 lb (81.647 kg)    Usual Body Weight: 185 lbs   % Usual Body Weight: 70%   BMI:  Body mass index is 15.84 kg/(m^2).  Estimated Nutritional Needs: Kcal: 1800- 2000 Protein: 90 - 100 g Fluid: 1.8 - 2 L/ day  Skin: WNL  Diet Order: General  EDUCATION NEEDS: -No education needs identified at this time   Intake/Output Summary (Last 24 hours) at 11/14/13 1023 Last data filed at 11/14/13 0700  Gross per 24 hour  Intake 1586.25 ml  Output      0 ml  Net 1586.25 ml    Last BM: PTA    Labs:   Recent Labs Lab 11/13/13 2005 11/14/13 0406  NA 117* 125*  K 2.4* 2.8*  CL 67* 82*  CO2 29 31  BUN 20 17  CREATININE 1.03 0.90  CALCIUM 10.5 8.7  MG 2.2  --   GLUCOSE 136* 93    CBG (last 3)  No results found for this basename: GLUCAP,  in the last 72 hours  Scheduled Meds: . cloNIDine  0.1 mg Oral QID   Followed by  . [START ON 11/16/2013] cloNIDine  0.1 mg Oral BH-qamhs   Followed by  . [START ON 11/19/2013] cloNIDine  0.1 mg Oral QAC breakfast  . nicotine  21 mg Transdermal Daily  . pantoprazole  40 mg Oral Daily  . potassium chloride  40 mEq Oral Once  . sodium chloride  3 mL Intravenous Q12H  . vancomycin  1,000 mg Intravenous Q12H     Continuous Infusions: . 0.9 % NaCl with KCl 40 mEq / L 125 mL/hr at 11/14/13 [id number]    Past Medical History  Diagnosis Date  . Pneumothorax   . Shingles   . Heroin abuse     Past Surgical History  Procedure Laterality Date  . Fracture surgery      right hand fracture repaired with plate [id number]  . Hernia repair      as a child    Eppie Gibsonebekah L Matznick, BS Dietetic Intern Pager: [phone]  Intern note reviewed.   Oran ReinLaura Jobe, RD, LDN Clinical Inpatient Dietitian Pager:  [phone] Weekend and after hours pager:  [phone]

## 2013-11-14 NOTE — Progress Notes (Signed)
CRITICAL VALUE ALERT  Critical value received:  K+ 2.8  Date of notification:  11/14/2013  Time of notification:  0520  Critical value read back:yes  Nurse who received alert:  J.Terrell Shimko  MD notified (1st page):  K.Schorr  Time of first page:  0522  MD notified (2nd page):  Time of second page:  Responding MD:  K.schorr  Time MD responded:  0530

## 2013-11-15 DIAGNOSIS — F19939 Other psychoactive substance use, unspecified with withdrawal, unspecified: Principal | ICD-10-CM

## 2013-11-15 LAB — CBC
HCT: 27.5 % — ABNORMAL LOW (ref 39.0–52.0)
HEMATOCRIT: 28.3 % — AB (ref 39.0–52.0)
HEMOGLOBIN: 10.3 g/dL — AB (ref 13.0–17.0)
Hemoglobin: 10 g/dL — ABNORMAL LOW (ref 13.0–17.0)
MCH: 30.6 pg (ref 26.0–34.0)
MCH: 30.7 pg (ref 26.0–34.0)
MCHC: 36.4 g/dL — AB (ref 30.0–36.0)
MCHC: 36.4 g/dL — AB (ref 30.0–36.0)
MCV: 84.1 fL (ref 78.0–100.0)
MCV: 84.2 fL (ref 78.0–100.0)
Platelets: 150 10*3/uL (ref 150–400)
Platelets: 170 10*3/uL (ref 150–400)
RBC: 3.27 MIL/uL — ABNORMAL LOW (ref 4.22–5.81)
RBC: 3.36 MIL/uL — ABNORMAL LOW (ref 4.22–5.81)
RDW: 11.2 % — ABNORMAL LOW (ref 11.5–15.5)
RDW: 11.4 % — ABNORMAL LOW (ref 11.5–15.5)
WBC: 4.6 10*3/uL (ref 4.0–10.5)
WBC: 5.5 10*3/uL (ref 4.0–10.5)

## 2013-11-15 LAB — BASIC METABOLIC PANEL
BUN: 10 mg/dL (ref 6–23)
CO2: 26 mEq/L (ref 19–32)
Calcium: 8.4 mg/dL (ref 8.4–10.5)
Chloride: 98 mEq/L (ref 96–112)
Creatinine, Ser: 0.77 mg/dL (ref 0.50–1.35)
Glucose, Bld: 97 mg/dL (ref 70–99)
Potassium: 4.3 mEq/L (ref 3.7–5.3)
Sodium: 133 mEq/L — ABNORMAL LOW (ref 137–147)

## 2013-11-15 LAB — HEMOGLOBIN AND HEMATOCRIT, BLOOD
HCT: 27.4 % — ABNORMAL LOW (ref 39.0–52.0)
Hemoglobin: 9.8 g/dL — ABNORMAL LOW (ref 13.0–17.0)

## 2013-11-15 LAB — PROTIME-INR
INR: 1.19 (ref 0.00–1.49)
Prothrombin Time: 14.8 seconds (ref 11.6–15.2)

## 2013-11-15 LAB — HIV ANTIBODY (ROUTINE TESTING W REFLEX): HIV 1&2 Ab, 4th Generation: NONREACTIVE

## 2013-11-15 LAB — APTT: aPTT: 29 seconds (ref 24–37)

## 2013-11-15 MED ORDER — LORAZEPAM 0.5 MG PO TABS
0.5000 mg | ORAL_TABLET | Freq: Once | ORAL | Status: AC
  Administered 2013-11-15: 0.5 mg via ORAL
  Filled 2013-11-15: qty 1

## 2013-11-15 MED ORDER — IBUPROFEN 800 MG PO TABS
400.0000 mg | ORAL_TABLET | Freq: Three times a day (TID) | ORAL | Status: DC | PRN
  Administered 2013-11-15: 400 mg via ORAL
  Filled 2013-11-15: qty 1

## 2013-11-15 MED ORDER — NAPROXEN 250 MG PO TABS
250.0000 mg | ORAL_TABLET | Freq: Two times a day (BID) | ORAL | Status: AC | PRN
  Administered 2013-11-15 – 2013-11-16 (×2): 250 mg via ORAL
  Filled 2013-11-15 (×3): qty 1

## 2013-11-15 MED ORDER — OXYCODONE HCL 5 MG PO TABS
5.0000 mg | ORAL_TABLET | Freq: Once | ORAL | Status: AC
  Administered 2013-11-15: 5 mg via ORAL
  Filled 2013-11-15: qty 1

## 2013-11-15 NOTE — Progress Notes (Addendum)
[facility] PROGRESS NOTE  Dacota Amelung HGD:[id number] DOB: 1980/10/31 DOA: 11/13/2013 PCP: Default, Provider, MD Brief Narrative: 33/M with h/o heroin abuse admitted with intractable vomiting and weakness due to heroin withdrawal. He has been trying to quit using for 2 weeks. Since then, he's eaten or drank very little, has been vomiting dozens of times a day. He was so weak he was barely able to walk, so called a friend to bring him to the emergency room. He has been to behavioral [facility], [facility], [place] for drug treatment. He has a history of heavy alcohol use, but has not drank for several months.  In the emergency room, sodium noted to be 117, potassium 2.4, chloride 67. Admitted treated with CLonidine protocol, IVF, supportive care  Assessment/Plan: Heroin Withdrawal -continue Clonidine protocol -supportive care, IVF , antiemetics, bentyl PRN -No narcotics  Heroin abuse -CSW consult -interested in quitting -HIV/hepatitis negative  Hypokalemia -improving -replace IV and PO  Hyponatremia -due to vomiting, dehydration, volume depletion -improved, continue IVF today  Cellulitis -improving -at site of self injection -Ancef, cold compresses  Constipation:  -followed by an episode of BRBPR following senna last pm -this am brown stool without blood -suspect drop in Hb is due to hemodilution, was getting NS at 125cc/hr since admission and 2L bolus in ER  DVT proph: SCDs now  Code Status: Full Code Family Communication: none at bedside Disposition Plan: home pending improvement   Antibiotics:  vancomycin  HPI/Subjective: No further nausea or vomiting, reports some tremors Complains of pain all over, BM with BRBPR after senna last night No blood in BM today  Objective: Filed Vitals:   11/15/13 1011  BP: 96/53  Pulse: 61  Temp: 98.6 F (37 C)  Resp: 16    Intake/Output Summary (Last 24 hours) at 11/15/13 1229 Last data filed at  11/15/13 1000  Gross per 24 hour  Intake 4285.83 ml  Output   1751 ml  Net 2534.83 ml   Filed Weights   11/13/13 1934 11/13/13 2312  Weight: 58.968 kg (130 lb) 59 kg (130 lb 1.1 oz)    Exam:   General:  AAOx3, no distress but anxious appearing  Cardiovascular: S1S2/RRR  Respiratory:CTAB  Abdomen: soft, Nt, BS present  Musculoskeletal:no edema c/c, redness and swelling overlying the Anterior antecubital space improving  Needle marks on arm  Skin: tattoos all over   Data Reviewed: Basic Metabolic Panel:  Recent Labs Lab 11/13/13 2005 11/14/13 0406 11/15/13 0127  NA 117* 125* 133*  K 2.4* 2.8* 4.3  CL 67* 82* 98  CO2 29 31 26   GLUCOSE 136* 93 97  BUN 20 17 10   CREATININE 1.03 0.90 0.77  CALCIUM 10.5 8.7 8.4  MG 2.2  --   --    Liver Function Tests:  Recent Labs Lab 11/13/13 2005  AST 19  ALT 19  ALKPHOS 104  BILITOT 1.6*  PROT 8.7*  ALBUMIN 4.8    Recent Labs Lab 11/13/13 2005  LIPASE 91*   No results found for this basename: AMMONIA,  in the last 168 hours CBC:  Recent Labs Lab 11/13/13 2005 11/14/13 0406 11/15/13 0127 11/15/13 0345 11/15/13 0835  WBC 11.6* 9.4 4.6  --  5.5  HGB 15.8 12.5* 10.0* 9.8* 10.3*  HCT 40.6 32.5* 27.5* 27.4* 28.3*  MCV 79.1 80.8 84.1  --  84.2  PLT 260 221 150  --  170   Cardiac Enzymes: No results found for this basename: CKTOTAL, CKMB, CKMBINDEX, TROPONINI,  in  the last 168 hours BNP (last 3 results) No results found for this basename: PROBNP,  in the last 8760 hours CBG: No results found for this basename: GLUCAP,  in the last 168 hours  No results found for this or any previous visit (from the past 240 hour(s)).   Studies: No results found.  Scheduled Meds: .  ceFAZolin (ANCEF) IV  1 g Intravenous 3 times per day  . cloNIDine  0.1 mg Oral QID   Followed by  . [START ON 11/16/2013] cloNIDine  0.1 mg Oral BH-qamhs   Followed by  . [START ON 11/19/2013] cloNIDine  0.1 mg Oral QAC breakfast  .  feeding supplement (ENSURE COMPLETE)  237 mL Oral TID WC  . nicotine  21 mg Transdermal Daily  . pantoprazole  40 mg Oral Daily  . sodium chloride  3 mL Intravenous Q12H   Continuous Infusions: . 0.9 % NaCl with KCl 40 mEq / L 50 mL/hr (11/15/13 0715)   Antibiotics Given (last 72 hours)   Date/Time Action Medication Dose Rate   11/14/13 0157 Given   vancomycin (VANCOCIN) IVPB 1000 mg/200 mL premix 1,000 mg 200 mL/hr   11/14/13 1410 Given   ceFAZolin (ANCEF) IVPB 1 g/50 mL premix 1 g 100 mL/hr   11/14/13 2106 Given   ceFAZolin (ANCEF) IVPB 1 g/50 mL premix 1 g 100 mL/hr   11/15/13 0533 Given   ceFAZolin (ANCEF) IVPB 1 g/50 mL premix 1 g 100 mL/hr      Principal Problem:   Hypokalemia Active Problems:   Opioid dependence   Cellulitis of left arm   Tobacco abuse   Marijuana use   Dehydration   Intractable vomiting   Hyponatremia   Protein-calorie malnutrition, severe   Heroin withdrawal   Time spent: 30min   Zannie CovePreetha Aarna Mihalko  [facility] Pager [phone]. If 7PM-7AM, please contact night-coverage at [email or web address], password [email or web address] 11/15/2013, 12:29 PM  LOS: 2 days

## 2013-11-15 NOTE — Progress Notes (Signed)
Patient reported to RN that he had bloody stool earlier after not having a BM for "2-3 weeks". Said there was small, formed bit of stool and "lots of bright red blood with some coagulated blood" in the toilet. Did not save stool to show RN. Donnamarie Poag, NP notified and new orders placed. Will continue to monitor patient.

## 2013-11-16 LAB — CBC
HCT: 29.5 % — ABNORMAL LOW (ref 39.0–52.0)
HEMOGLOBIN: 10.6 g/dL — AB (ref 13.0–17.0)
MCH: 30.6 pg (ref 26.0–34.0)
MCHC: 35.9 g/dL (ref 30.0–36.0)
MCV: 85.3 fL (ref 78.0–100.0)
Platelets: 207 10*3/uL (ref 150–400)
RBC: 3.46 MIL/uL — ABNORMAL LOW (ref 4.22–5.81)
RDW: 11.6 % (ref 11.5–15.5)
WBC: 7 10*3/uL (ref 4.0–10.5)

## 2013-11-16 LAB — BASIC METABOLIC PANEL
BUN: 8 mg/dL (ref 6–23)
CALCIUM: 8.9 mg/dL (ref 8.4–10.5)
CO2: 20 meq/L (ref 19–32)
CREATININE: 0.63 mg/dL (ref 0.50–1.35)
Chloride: 102 mEq/L (ref 96–112)
GFR calc Af Amer: >90 mL/min (ref >90)
GLUCOSE: 110 mg/dL — AB (ref 70–99)
Potassium: 4.2 mEq/L (ref 3.7–5.3)
SODIUM: 136 meq/L — AB (ref 137–147)

## 2013-11-16 MED ORDER — CEPHALEXIN 500 MG PO CAPS: 500.0000 mg | ORAL_CAPSULE | Freq: Two times a day (BID) | ORAL | Status: AC

## 2013-11-16 MED ORDER — LORAZEPAM 1 MG PO TABS
1.0000 mg | ORAL_TABLET | Freq: Four times a day (QID) | ORAL | Status: DC | PRN
  Administered 2013-11-16: 1 mg via ORAL
  Filled 2013-11-16: qty 1

## 2013-11-16 MED ORDER — CLONIDINE HCL 0.1 MG PO TABS: 0.1000 mg | ORAL_TABLET | ORAL | Status: DC

## 2013-11-16 MED ORDER — LORAZEPAM 0.5 MG PO TABS
0.5000 mg | ORAL_TABLET | Freq: Once | ORAL | Status: AC
  Administered 2013-11-16: 0.5 mg via ORAL
  Filled 2013-11-16: qty 1

## 2013-11-16 MED ORDER — LORAZEPAM 1 MG PO TABS: 1.0000 mg | ORAL_TABLET | Freq: Four times a day (QID) | ORAL | Status: AC | PRN

## 2013-11-16 MED ORDER — ONDANSETRON 4 MG PO TBDP: 4.0000 mg | ORAL_TABLET | Freq: Four times a day (QID) | ORAL | Status: AC | PRN

## 2013-11-16 MED ORDER — NICOTINE 21 MG/24HR TD PT24: 21.0000 mg | MEDICATED_PATCH | Freq: Every day | TRANSDERMAL | Status: DC

## 2013-11-16 MED ORDER — CEPHALEXIN 500 MG PO CAPS
500.0000 mg | ORAL_CAPSULE | Freq: Two times a day (BID) | ORAL | Status: DC
  Filled 2013-11-16 (×2): qty 1

## 2013-11-16 MED ORDER — ENSURE COMPLETE PO LIQD: 237.0000 mL | Freq: Three times a day (TID) | ORAL | Status: DC

## 2013-11-16 MED ORDER — CLONIDINE HCL 0.1 MG PO TABS: 0.1000 mg | ORAL_TABLET | Freq: Every day | ORAL | Status: AC

## 2013-11-16 NOTE — Discharge Summary (Addendum)
Physician Discharge Summary  Joseph Duarte ZTI:[id number] DOB: 1981-05-13 DOA: 11/13/2013  PCP: Default, Provider, MD  Admit date: 11/13/2013 Discharge date: 11/16/2013  Time spent: > 35 minutes  Recommendations for Outpatient Follow-up:  1. Please keep appointment with your PCP for 1 week from hospital discharge 2. Reassess cellulitis and decide whether or not to prolong antibiotic regimen 3. Consider reassessing sodium levels. On D/c was 136  Discharge Diagnoses:  Principal Problem:   Hypokalemia Active Problems:   Opioid dependence   Cellulitis of left arm   Tobacco abuse   Marijuana use   Dehydration   Intractable vomiting   Hyponatremia   Protein-calorie malnutrition, severe   Heroin withdrawal   Discharge Condition: stable  Diet recommendation: regular diet  Filed Weights   11/13/13 1934 11/13/13 2312  Weight: 58.968 kg (130 lb) 59 kg (130 lb 1.1 oz)    History of present illness:  33 y/o with history of Heroid addiction who presented to the hospital with heroin withdrawal, cellulitis, hyponatremia, and nausea and vomiting  Hospital Course:  Heroin withdrawal - will d/c with prescription for clonidine and ativan - Pt to follow up with his PCP in 1 week - nausea and emesis resolved currently: will prescribe zofran on d/c - encouraged continued cessation which patient verbalizes he will continue to avoid  Cellulitis - 2ary to injections from heroin most likely - Keflex for 5 more days  Hyponatremia - resolving with improved oral intake and IV fluid normal saline (which was administered initially)   Procedures:  None  Consultations:  None  Discharge Exam: Filed Vitals:   11/16/13 0522  BP: 129/70  Pulse: 99  Temp: 98 F (36.7 C)  Resp: 20    General: Pt in NAD, alert and awake Cardiovascular: RRR, no MRG Respiratory: CTA BL, no wheezes Skin: Cellulitis near left cubital fossa  Discharge Instructions You were cared for by a hospitalist  during your hospital stay. If you have any questions about your discharge medications or the care you received while you were in the hospital after you are discharged, you can call the unit and asked to speak with the hospitalist on call if the hospitalist that took care of you is not available. Once you are discharged, your primary care physician will handle any further medical issues. Please note that NO REFILLS for any discharge medications will be authorized once you are discharged, as it is imperative that you return to your primary care physician (or establish a relationship with a primary care physician if you do not have one) for your aftercare needs so that they can reassess your need for medications and monitor your lab values.  Discharge Instructions   Call MD for:  difficulty breathing, headache or visual disturbances    Complete by:  As directed      Call MD for:  temperature >100.4    Complete by:  As directed      Diet - low sodium heart healthy    Complete by:  As directed      Discharge instructions    Complete by:  As directed   Pt reports that he has a physician in [place] he follows up with and he plans to see him in 1 week from discharge date.     Increase activity slowly    Complete by:  As directed             Medication List    STOP taking these medications  naproxen sodium 220 MG tablet  Commonly known as:  ANAPROX      TAKE these medications       cephALEXin 500 MG capsule  Commonly known as:  KEFLEX  Take 1 capsule (500 mg total) by mouth every 12 (twelve) hours.     cloNIDine 0.1 MG tablet  Commonly known as:  CATAPRES  Take 1 tablet (0.1 mg total) by mouth 2 (two) times daily in the am and at bedtime..     cloNIDine 0.1 MG tablet  Commonly known as:  CATAPRES  Take 1 tablet (0.1 mg total) by mouth daily before breakfast.  Start taking on:  11/19/2013     feeding supplement (ENSURE COMPLETE) Liqd  Take 237 mLs by mouth 3 (three) times daily  with meals.     LORazepam 1 MG tablet  Commonly known as:  ATIVAN  Take 1 tablet (1 mg total) by mouth every 6 (six) hours as needed for anxiety.     ondansetron 4 MG disintegrating tablet  Commonly known as:  ZOFRAN-ODT  Take 1 tablet (4 mg total) by mouth every 6 (six) hours as needed for nausea or vomiting.     ranitidine 150 MG tablet  Commonly known as:  ZANTAC  Take 150 mg by mouth daily as needed for heartburn (heart burn).       Allergies  Allergen Reactions  . Bee Venom Anaphylaxis  . Haldol [Haloperidol Lactate] Other (See Comments)    EPS (skeletal seizure)  . Vancomycin Hives      The results of significant diagnostics from this hospitalization (including imaging, microbiology, ancillary and laboratory) are listed below for reference.    Significant Diagnostic Studies: No results found.  Microbiology: No results found for this or any previous visit (from the past 240 hour(s)).   Labs: Basic Metabolic Panel:  Recent Labs Lab 11/13/13 2005 11/14/13 0406 11/15/13 0127 11/16/13 0450  NA 117* 125* 133* 136*  K 2.4* 2.8* 4.3 4.2  CL 67* 82* 98 102  CO2 29 31 26 20   GLUCOSE 136* 93 97 110*  BUN 20 17 10 8   CREATININE 1.03 0.90 0.77 0.63  CALCIUM 10.5 8.7 8.4 8.9  MG 2.2  --   --   --    Liver Function Tests:  Recent Labs Lab 11/13/13 2005  AST 19  ALT 19  ALKPHOS 104  BILITOT 1.6*  PROT 8.7*  ALBUMIN 4.8    Recent Labs Lab 11/13/13 2005  LIPASE 91*   No results found for this basename: AMMONIA,  in the last 168 hours CBC:  Recent Labs Lab 11/13/13 2005 11/14/13 0406 11/15/13 0127 11/15/13 0345 11/15/13 0835 11/16/13 0450  WBC 11.6* 9.4 4.6  --  5.5 7.0  HGB 15.8 12.5* 10.0* 9.8* 10.3* 10.6*  HCT 40.6 32.5* 27.5* 27.4* 28.3* 29.5*  MCV 79.1 80.8 84.1  --  84.2 85.3  PLT 260 221 150  --  170 207   Cardiac Enzymes: No results found for this basename: CKTOTAL, CKMB, CKMBINDEX, TROPONINI,  in the last 168 hours BNP: BNP (last  3 results) No results found for this basename: PROBNP,  in the last 8760 hours CBG: No results found for this basename: GLUCAP,  in the last 168 hours     Signed:  [name]  [facility] 11/16/2013, 9:36 AM    Addendum: I have recommended that the patient monitor his own blood pressure while on clonidine. He verbalizes his understanding and agreement and reassures me that  this can be done on discharge.

## 2013-11-21 ENCOUNTER — Encounter (HOSPITAL_COMMUNITY): Payer: Self-pay | Admitting: Emergency Medicine

## 2013-11-21 ENCOUNTER — Emergency Department (HOSPITAL_COMMUNITY)
Admission: EM | Admit: 2013-11-21 | Discharge: 2013-11-21 | Disposition: A | Discharge status: Home / Self Care | Payer: Self-pay | Attending: Emergency Medicine | Admitting: Emergency Medicine

## 2013-11-21 DIAGNOSIS — L02414 Cutaneous abscess of left upper limb: Secondary | ICD-10-CM

## 2013-11-21 DIAGNOSIS — Z8709 Personal history of other diseases of the respiratory system: Secondary | ICD-10-CM | POA: Insufficient documentation

## 2013-11-21 DIAGNOSIS — Z79899 Other long term (current) drug therapy: Secondary | ICD-10-CM | POA: Insufficient documentation

## 2013-11-21 DIAGNOSIS — F17200 Nicotine dependence, unspecified, uncomplicated: Secondary | ICD-10-CM | POA: Insufficient documentation

## 2013-11-21 DIAGNOSIS — Z8619 Personal history of other infectious and parasitic diseases: Secondary | ICD-10-CM | POA: Insufficient documentation

## 2013-11-21 DIAGNOSIS — IMO0002 Reserved for concepts with insufficient information to code with codable children: Secondary | ICD-10-CM | POA: Insufficient documentation

## 2013-11-21 DIAGNOSIS — Z792 Long term (current) use of antibiotics: Secondary | ICD-10-CM | POA: Insufficient documentation

## 2013-11-21 MED ORDER — SULFAMETHOXAZOLE-TMP DS 800-160 MG PO TABS
1.0000 | ORAL_TABLET | Freq: Once | ORAL | Status: AC
  Administered 2013-11-21: 1 via ORAL
  Filled 2013-11-21: qty 1

## 2013-11-21 MED ORDER — SULFAMETHOXAZOLE-TRIMETHOPRIM 800-160 MG PO TABS: 1.0000 | ORAL_TABLET | Freq: Two times a day (BID) | ORAL | Status: AC

## 2013-11-21 NOTE — Discharge Instructions (Signed)
Abscess An abscess is an infected area that contains a collection of pus and debris.It can occur in almost any part of the body. An abscess is also known as a furuncle or boil. CAUSES  An abscess occurs when tissue gets infected. This can occur from blockage of oil or sweat glands, infection of hair follicles, or a minor injury to the skin. As the body tries to fight the infection, pus collects in the area and creates pressure under the skin. This pressure causes pain. People with weakened immune systems have difficulty fighting infections and get certain abscesses more often.  SYMPTOMS Usually an abscess develops on the skin and becomes a painful mass that is red, warm, and tender. If the abscess forms under the skin, you may feel a moveable soft area under the skin. Some abscesses break open (rupture) on their own, but most will continue to get worse without care. The infection can spread deeper into the body and eventually into the bloodstream, causing you to feel ill.  DIAGNOSIS  Your caregiver will take your medical history and perform a physical exam. A sample of fluid may also be taken from the abscess to determine what is causing your infection. TREATMENT  Your caregiver may prescribe antibiotic medicines to fight the infection. However, taking antibiotics alone usually does not cure an abscess. Your caregiver may need to make a small cut (incision) in the abscess to drain the pus. In some cases, gauze is packed into the abscess to reduce pain and to continue draining the area. HOME CARE INSTRUCTIONS   Only take over-the-counter or prescription medicines for pain, discomfort, or fever as directed by your caregiver.  If you were prescribed antibiotics, take them as directed. Finish them even if you start to feel better.  If gauze is used, follow your caregiver's directions for changing the gauze.  To avoid spreading the infection:  Keep your draining abscess covered with a  bandage.  Wash your hands well.  Do not share personal care items, towels, or whirlpools with others.  Avoid skin contact with others.  Keep your skin and clothes clean around the abscess.  Keep all follow-up appointments as directed by your caregiver. SEEK MEDICAL CARE IF:   You have increased pain, swelling, redness, fluid drainage, or bleeding.  You have muscle aches, chills, or a general ill feeling.  You have a fever. MAKE SURE YOU:   Understand these instructions.  Will watch your condition.  Will get help right away if you are not doing well or get worse. Document Released: 03/19/2005 Document Revised: 12/09/2011 Document Reviewed: 08/22/2011 [employer] Patient Information 2014 [employer].  

## 2013-11-21 NOTE — ED Provider Notes (Signed)
CSN: [id number]     Arrival date & time 11/21/13  2108 History   First MD Initiated Contact with Patient 11/21/13 2320     Chief Complaint  Patient presents with  . Wound Infection     (Consider location/radiation/quality/duration/timing/severity/associated sxs/prior Treatment) HPI Patient with a history of heroin abuse.  He presents the abscess to his left antecubital fossa.  He states that he's been on antibiotics as he was an inpatient last week.  He never had incision and drainage.  Moderate amount of pus can add this area yesterday.  He continues to have some redness.  He is without antibiotics at this time.  He presents to the ER complaining of mild pain.  No fevers or chills.  No other complaints.   Past Medical History  Diagnosis Date  . Pneumothorax   . Shingles   . Heroin abuse    Past Surgical History  Procedure Laterality Date  . Fracture surgery      right hand fracture repaired with plate 5400  . Hernia repair      as a child   No family history on file. History  Substance Use Topics  . Smoking status: Current Every Day Smoker -- 1.00 packs/day for 17 years    Types: Cigarettes  . Smokeless tobacco: Former Neurosurgeon    Quit date: 02/17/2012  . Alcohol Use: Yes     Comment: 4-5 fifths/wk    Review of Systems  All other systems reviewed and are negative.     Allergies  Bee venom; Haldol; and Vancomycin  Home Medications   Prior to Admission medications   Medication Sig Start Date End Date Taking? Authorizing Provider  cephALEXin (KEFLEX) 500 MG capsule Take 1 capsule (500 mg total) by mouth every 12 (twelve) hours. 11/16/13   Penny Pia, MD  cloNIDine (CATAPRES) 0.1 MG tablet Take 1 tablet (0.1 mg total) by mouth daily before breakfast. 11/19/13   Penny Pia, MD  LORazepam (ATIVAN) 1 MG tablet Take 1 tablet (1 mg total) by mouth every 6 (six) hours as needed for anxiety. 11/16/13   Penny Pia, MD  ondansetron (ZOFRAN-ODT) 4 MG disintegrating tablet Take  1 tablet (4 mg total) by mouth every 6 (six) hours as needed for nausea or vomiting. 11/16/13   Penny Pia, MD  ranitidine (ZANTAC) 150 MG tablet Take 150 mg by mouth daily as needed for heartburn.     Historical Provider, MD  sulfamethoxazole-trimethoprim (SEPTRA DS) 800-160 MG per tablet Take 1 tablet by mouth every 12 (twelve) hours. 11/21/13   Lyanne Co, MD   BP 103/65  Pulse 82  Temp(Src) 98.5 F (36.9 C) (Oral)  Resp 18  Ht 6\' 4"  (1.93 m)  Wt 150 lb (68.04 kg)  BMI 18.27 kg/m2  SpO2 100% Physical Exam  Nursing note and vitals reviewed. Constitutional: He is oriented to person, place, and time. He appears well-developed and well-nourished.  HENT:  Head: Normocephalic.  Eyes: EOM are normal.  Neck: Normal range of motion.  Pulmonary/Chest: Effort normal.  Abdominal: He exhibits no distension.  Musculoskeletal: Normal range of motion.  Small very superficial abscess of his left antecubital fossa.  There is a small amount of surrounding cellulitis.  Ultrasound used to visualize abscess.  Small amount of pus present on ultrasound with the artery lying just underneath the abscess.  Neurological: He is alert and oriented to person, place, and time.  Psychiatric: He has a normal mood and affect.    ED Course  Procedures (including critical care time)  EMERGENCY DEPARTMENT US SOFT TISSUE INTERPRETATION "Study: Limited Soft Tissue Ultrasound" INDICATIONS: Soft tissue infection Multiple views of the body part were obtained in real-time with a multi-frequency linear probe PERFORMED BY:  Myself IMAGES ARCHIVED?: Yes SIDE:Left BODY PART:Upper extremity FINDINGS: Abcess present INTERPRETATION:  Abcess present  INCISION AND DRAINAGE Performed by: Lyanne CoKevin M Kyri Shader Consent: Verbal consent obtained. Risks and benefits: risks, benefits and alternatives were discussed Time out performed prior to procedure Type: abscess Body area: left proximal forearm Anesthesia: local  infiltration Incision was made with a scalpel. Local anesthetic: lidocaine 2% with epinephrine Anesthetic total: 3 ml Complexity: complex Blunt dissection to break up loculations Drainage: purulent Drainage amount: small Packing material: none Patient tolerance: Patient tolerated the procedure well with no immediate complications.      Labs Review Labs Reviewed - No data to display  Imaging Review No results found.   EKG Interpretation None      MDM   Final diagnoses:  Abscess of left forearm    i and d. Placed on abx. Understands to return to ER for new or worsening symptoms.  Care was taken to stay superficial to avoid the artery just deep to the abscess    Lyanne CoKevin M Johnathin Vanderschaaf, MD 11/21/13 [phone]

## 2013-11-21 NOTE — ED Notes (Signed)
Pt states was recently admitted to hospital for kidney problems, got an infection in L arm, was sent home on antibiotics which he has finished, yesterday has area "pop" and drained blood and puss, pt states arm is not getting any better.

## 2015-03-25 IMAGING — CR DG CHEST 1V PORT
2 series · 2 of 2 positions shown · non-contrast
Comparison: 07/01/2011

CLINICAL DATA: Drug overdose. Chest pain. History of previous
pneumothorax.

EXAM:
PORTABLE CHEST - 1 VIEW

[AP (1 of 2)]
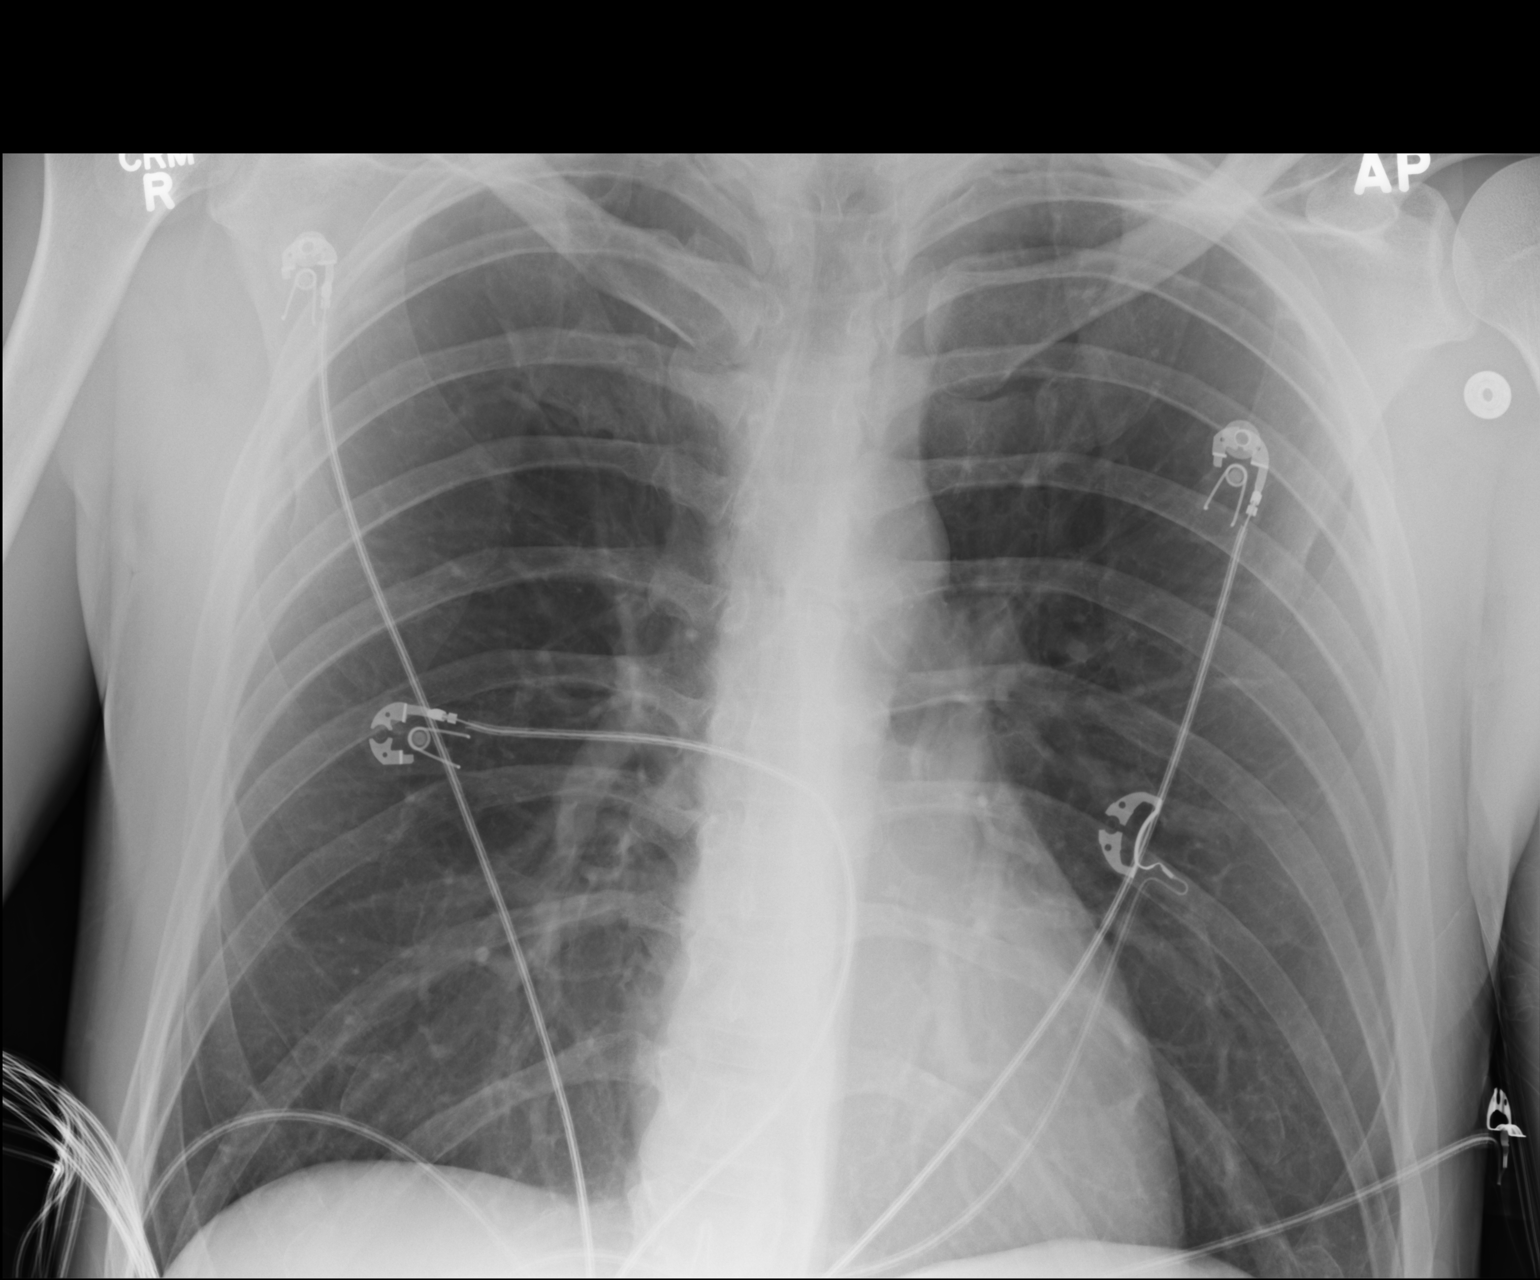

[AP (2 of 2)]
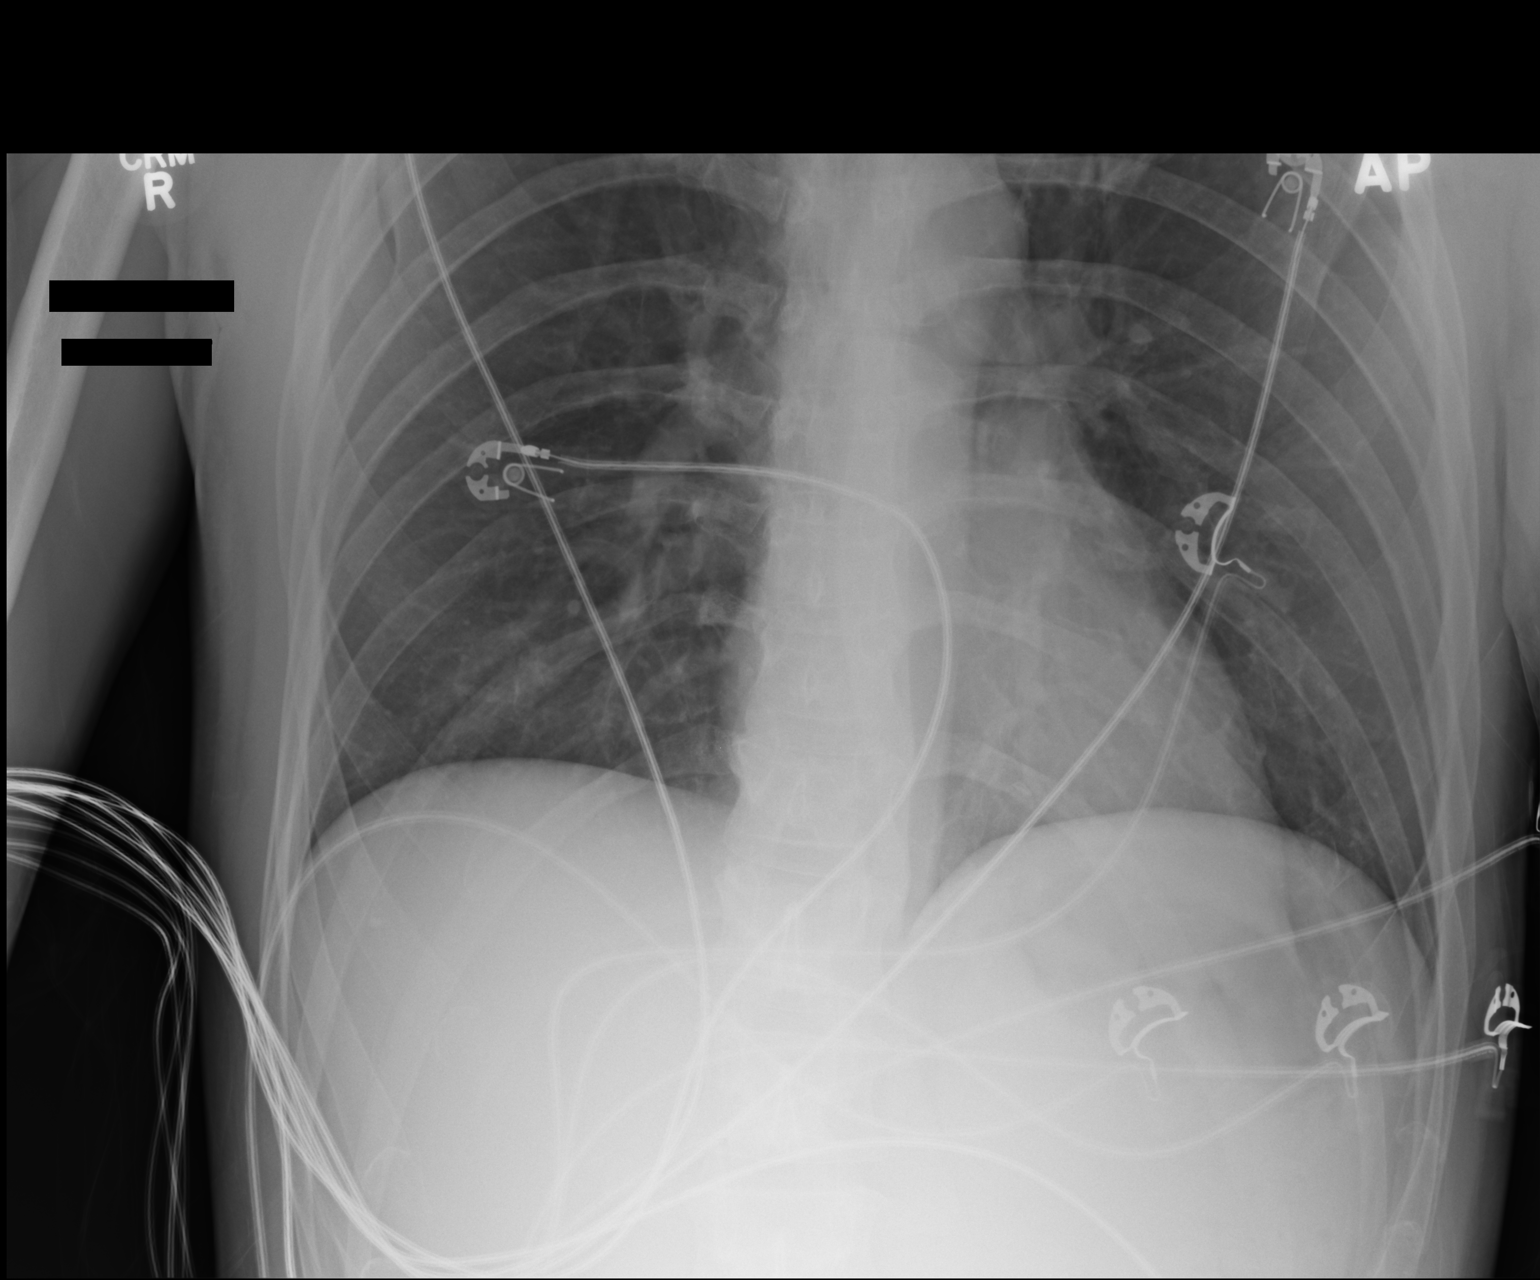

[2 of 2 positions shown; findings below may reference images not displayed]

FINDINGS: Pulmonary hyperinflation suggesting asthma or emphysema. Normal
heart size and pulmonary vascularity. No focal consolidation or
airspace disease. No blunting of costophrenic angles. No
pneumothorax. No significant change since previous study.
IMPRESSION: Emphysematous changes in the lungs. No evidence of active pulmonary
disease.

## 2019-06-26 ENCOUNTER — Other Ambulatory Visit: Payer: Self-pay

## 2019-06-26 ENCOUNTER — Emergency Department (HOSPITAL_COMMUNITY)
Admission: EM | Admit: 2019-06-26 | Discharge: 2019-06-27 | Disposition: A | Discharge status: Home / Self Care | Payer: Medicaid - Out of State | Attending: Emergency Medicine | Admitting: Emergency Medicine

## 2019-06-26 DIAGNOSIS — F10920 Alcohol use, unspecified with intoxication, uncomplicated: Secondary | ICD-10-CM | POA: Insufficient documentation

## 2019-06-26 DIAGNOSIS — Z79899 Other long term (current) drug therapy: Secondary | ICD-10-CM | POA: Diagnosis not present

## 2019-06-26 DIAGNOSIS — F17210 Nicotine dependence, cigarettes, uncomplicated: Secondary | ICD-10-CM | POA: Diagnosis not present

## 2019-06-26 DIAGNOSIS — F10929 Alcohol use, unspecified with intoxication, unspecified: Secondary | ICD-10-CM | POA: Diagnosis present

## 2019-06-26 NOTE — ED Provider Notes (Signed)
[facility] DEPT Provider Note   CSN: [id number] Arrival date & time: 06/26/19  2211     History Chief Complaint  Patient presents with  . Alcohol Intoxication    LEVEL 5 CAVEAT 2/2 INTOXICATION   Joseph Duarte is a 39 y.o. male.  39 year old male with a history of heroin abuse, pneumothorax, shingles presents to the emergency department via EMS.  Patient coming from [employer] where he was witnessed by staff taking multiple double shots of liquor.  When restaurant was closing, patient found slumped over in a booth. Patient does endorse drinking tonight, but is able to contribute more to history secondary to intoxication.  The history is provided by the patient. No language interpreter was used.  Alcohol Intoxication       Past Medical History:  Diagnosis Date  . Heroin abuse   . Pneumothorax   . Shingles     Patient Active Problem List   Diagnosis Date Noted  . Protein-calorie malnutrition, severe ([place]) 11/14/2013  . Heroin withdrawal ([place]) 11/14/2013  . Hypokalemia 11/13/2013  . Cellulitis of left arm 11/13/2013  . Tobacco abuse 11/13/2013  . Marijuana use 11/13/2013  . Dehydration 11/13/2013  . Intractable vomiting 11/13/2013  . Hyponatremia 11/13/2013  . Opioid dependence ([place]) 02/17/2012  . Alcohol dependence ([place]) 02/17/2012  . Substance induced mood disorder ([place]) 02/17/2012    Past Surgical History:  Procedure Laterality Date  . FRACTURE SURGERY     right hand fracture repaired with plate 2010  . HERNIA REPAIR     as a child       No family history on file.  Social History   Tobacco Use  . Smoking status: Current Every Day Smoker    Packs/day: 1.00    Years: 17.00    Pack years: 17.00    Types: Cigarettes  . Smokeless tobacco: Former Systems developer    Quit date: 02/17/2012  Substance Use Topics  . Alcohol use: Yes    Comment: 4-5 fifths/wk  . Drug use: Yes    Types: Heroin    Comment: heroin 7grams/week  1200-1400.00    Home Medications Prior to Admission medications   Medication Sig Start Date End Date Taking? Authorizing Provider  cephALEXin (KEFLEX) 500 MG capsule Take 1 capsule (500 mg total) by mouth every 12 (twelve) hours. 11/16/13   Velvet Bathe, MD  cloNIDine (CATAPRES) 0.1 MG tablet Take 1 tablet (0.1 mg total) by mouth daily before breakfast. 11/19/13   Velvet Bathe, MD  LORazepam (ATIVAN) 1 MG tablet Take 1 tablet (1 mg total) by mouth every 6 (six) hours as needed for anxiety. 11/16/13   Velvet Bathe, MD  ondansetron (ZOFRAN-ODT) 4 MG disintegrating tablet Take 1 tablet (4 mg total) by mouth every 6 (six) hours as needed for nausea or vomiting. 11/16/13   Velvet Bathe, MD  ranitidine (ZANTAC) 150 MG tablet Take 150 mg by mouth daily as needed for heartburn.     [provider]  sulfamethoxazole-trimethoprim (SEPTRA DS) 800-160 MG per tablet Take 1 tablet by mouth every 12 (twelve) hours. 11/21/13   Jola Schmidt, MD    Allergies    Bee venom, Haldol [haloperidol lactate], and Vancomycin  Review of Systems   Review of Systems  Unable to perform ROS: Other  Patient intoxicated   Physical Exam Updated Vital Signs BP 132/83   Pulse 83   Resp 14   Ht 5\' 10"  (1.778 m)   Wt 81 kg   SpO2  99%   BMI 25.62 kg/m   Physical Exam Vitals and nursing note reviewed.  Constitutional:      General: He is not in acute distress.    Appearance: He is well-developed. He is not diaphoretic.     Comments: Alert to sternal rubbing  HENT:     Head: Normocephalic and atraumatic.  Eyes:     General: No scleral icterus.    Conjunctiva/sclera: Conjunctivae normal.  Pulmonary:     Effort: Pulmonary effort is normal. No respiratory distress.     Comments: Respirations even and unlabored Musculoskeletal:        General: Normal range of motion.     Cervical back: Normal range of motion.  Skin:    General: Skin is warm and dry.     Coloration: Skin is not pale.     Findings: No  erythema or rash.  Neurological:     Mental Status: He is alert and oriented to person, place, and time.     Comments: Mumbles certain answers, though these responses are appropriate. Moving all extremities spontaneously.  Psychiatric:        Behavior: Behavior normal.     ED Results / Procedures / Treatments   Labs (all labs ordered are listed, but only abnormal results are displayed) Labs Reviewed - No data to display  EKG None  Radiology No results found.  Procedures Procedures (including critical care time)  Medications Ordered in ED Medications  ketorolac (TORADOL) 30 MG/ML injection 30 mg (has no administration in time range)  ondansetron (ZOFRAN-ODT) disintegrating tablet 8 mg (8 mg Oral Given 06/27/19 0350)  metoCLOPramide (REGLAN) injection 10 mg (10 mg Intramuscular Given 06/27/19 0557)    ED Course  I have reviewed the triage vital signs and the nursing notes.  Pertinent labs & imaging results that were available during my care of the patient were reviewed by me and considered in my medical decision making (see chart for details).  6:34 AM Vomiting has subsided. Patient ambulatory to bathroom without assistance. States he is presently residing at [facility].     MDM Rules/Calculators/A&P                       39 year old male presents for acute intoxication.  Had some vomiting while in the ED for which she was given Zofran and Reglan.  Later complaining of generalized body discomfort.  Toradol ordered.  He has been hemodynamically stable since arrival.  Much more awake and alert on repeat assessment.  Ambulatory to the bathroom without assistance.  Patient appropriate for discharge and follow-up with a primary care doctor as needed.  He has been given a Facilities manager given history of heroin abuse, though he presently resides at [facility].  Patient discharged in stable condition.   Final Clinical Impression(s) / ED Diagnoses Final diagnoses:  Alcoholic  intoxication without complication [facility])    Rx / DC Orders ED Discharge Orders    None       Antony Madura, PA-C 06/27/19 0636    Tilden Fossa, MD 06/27/19 1036

## 2019-06-26 NOTE — ED Triage Notes (Signed)
Pt presents to ED via [facility] coming from [employer] where staff called EMS out because pt had taken several double shots before closing time and then when it was time to close pt was slumped over in booth. Pt is alert to verbal and wakes up to answer questions, and is breathing on his own. EMS reports that pts VS WNL.

## 2019-06-27 ENCOUNTER — Other Ambulatory Visit: Payer: Self-pay

## 2019-06-27 MED ORDER — METOCLOPRAMIDE HCL 5 MG/ML IJ SOLN
10.0000 mg | Freq: Once | INTRAMUSCULAR | Status: AC
  Administered 2019-06-27: 06:00:00 10 mg via INTRAMUSCULAR
  Filled 2019-06-27: qty 2

## 2019-06-27 MED ORDER — KETOROLAC TROMETHAMINE 30 MG/ML IJ SOLN
30.0000 mg | Freq: Once | INTRAMUSCULAR | Status: AC
  Administered 2019-06-27: 07:00:00 30 mg via INTRAMUSCULAR
  Filled 2019-06-27: qty 1

## 2019-06-27 MED ORDER — ONDANSETRON 8 MG PO TBDP
8.0000 mg | ORAL_TABLET | Freq: Once | ORAL | Status: AC
  Administered 2019-06-27: 04:00:00 8 mg via ORAL
  Filled 2019-06-27: qty 1

## 2019-06-27 NOTE — ED Notes (Signed)
Pt is having an episode of emesis. I provided pt with an emesis bag and tissues. Pt asked if he could have some water, I informed him that he could not have any water while he was actively throwing up.

## 2019-06-27 NOTE — ED Notes (Signed)
Pt ambulated to and from RR without assistance.  Gait was steady.
